# Patient Record
Sex: Male | Born: 1985 | Race: Black or African American | Hispanic: No | Marital: Single | Smoking: Never smoker
Health system: Southern US, Community
[De-identification: ages and names within clinical notes are randomized; demographics above are authoritative.]

---

## 2007-03-01 ENCOUNTER — Ambulatory Visit (HOSPITAL_COMMUNITY): Admission: RE | Admit: 2007-03-01 | Discharge: 2007-03-01 | Payer: Self-pay | Admitting: Family Medicine

## 2011-06-02 ENCOUNTER — Inpatient Hospital Stay (INDEPENDENT_AMBULATORY_CARE_PROVIDER_SITE_OTHER)
Admission: RE | Admit: 2011-06-02 | Discharge: 2011-06-02 | Disposition: A | Discharge status: Home / Self Care | Payer: Self-pay | Source: Ambulatory Visit | Attending: Family Medicine | Admitting: Family Medicine

## 2011-06-02 DIAGNOSIS — J029 Acute pharyngitis, unspecified: Secondary | ICD-10-CM

## 2012-03-06 ENCOUNTER — Encounter (HOSPITAL_COMMUNITY): Payer: Self-pay | Admitting: *Deleted

## 2012-03-06 ENCOUNTER — Emergency Department (HOSPITAL_COMMUNITY): Payer: Managed Care, Other (non HMO)

## 2012-03-06 ENCOUNTER — Emergency Department (HOSPITAL_COMMUNITY)
Admission: EM | Admit: 2012-03-06 | Discharge: 2012-03-06 | Disposition: A | Discharge status: Home / Self Care | Payer: Managed Care, Other (non HMO) | Attending: Emergency Medicine | Admitting: Emergency Medicine

## 2012-03-06 DIAGNOSIS — R079 Chest pain, unspecified: Secondary | ICD-10-CM | POA: Insufficient documentation

## 2012-03-06 LAB — CBC
HCT: 39.3 % (ref 39.0–52.0)
Hemoglobin: 13.2 g/dL (ref 13.0–17.0)
MCH: 29.5 pg (ref 26.0–34.0)
MCHC: 33.6 g/dL (ref 30.0–36.0)
MCV: 87.9 fL (ref 78.0–100.0)

## 2012-03-06 LAB — POCT I-STAT, CHEM 8
Calcium, Ion: 1.26 mmol/L — ABNORMAL HIGH (ref 1.12–1.23)
Creatinine, Ser: 1.2 mg/dL (ref 0.50–1.35)
Glucose, Bld: 95 mg/dL (ref 70–99)
Hemoglobin: 13.9 g/dL (ref 13.0–17.0)
Potassium: 3.3 mEq/L — ABNORMAL LOW (ref 3.5–5.1)

## 2012-03-06 MED ORDER — POTASSIUM CHLORIDE CRYS ER 20 MEQ PO TBCR
20.0000 meq | EXTENDED_RELEASE_TABLET | Freq: Once | ORAL | Status: AC
  Administered 2012-03-06: 20 meq via ORAL
  Filled 2012-03-06: qty 1

## 2012-03-06 MED ORDER — IBUPROFEN 800 MG PO TABS: 800.0000 mg | ORAL_TABLET | Freq: Three times a day (TID) | ORAL | Status: AC

## 2012-03-06 NOTE — ED Provider Notes (Signed)
History     CSN: [id number]  Arrival date & time 03/06/12  0011   First MD Initiated Contact with Patient 03/06/12 0139      Chief Complaint  Patient presents with  . Chest Pain    (Consider location/radiation/quality/duration/timing/severity/associated sxs/prior treatment) HPI History provided by patient. Has been having on and off chest pain the last 8 months. He is able to show me with one finger left parasternal area where hurts. When it comes it lasts seconds. Is sharp in quality and severe. No known aggravating or alleviating factors. No shortness of breath. No nausea vomiting. No diaphoresis. 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 He denies any trauma. No recent illness. No cough cold congestion. Has never been evaluated for this. Is a nonsmoker. No medical problems. No known family history of heart problems. Currently is pain-free without complaints. History reviewed. No pertinent past medical history.  History reviewed. No pertinent past surgical history.  History reviewed. No pertinent family history.  History  Substance Use Topics  . Smoking status: Never Smoker   . Smokeless tobacco: Not on file  . Alcohol Use: Yes     socially      Review of Systems  Constitutional: Negative for fever and chills.  HENT: Negative for neck pain and neck stiffness.   Eyes: Negative for pain.  Respiratory: Negative for shortness of breath.   Cardiovascular: Positive for chest pain.  Gastrointestinal: Negative for abdominal pain.  Genitourinary: Negative for dysuria.  Musculoskeletal: Negative for back pain.  Skin: Negative for rash.  Neurological: Negative for headaches.  All other systems reviewed and are negative.    Allergies  Review of patient's allergies indicates no known allergies.  Home Medications  No current outpatient prescriptions on file.  BP 128/77  Pulse 62  Temp 98.5 F (36.9 C) (Oral)  Resp 18  SpO2 98%  Physical Exam    Constitutional: He is oriented to person, place, and time. He appears well-developed and well-nourished.  HENT:  Head: Normocephalic and atraumatic.  Eyes: Conjunctivae and EOM are normal. Pupils are equal, round, and reactive to light.  Neck: Trachea normal. Neck supple. No thyromegaly present.  Cardiovascular: Normal rate, regular rhythm, S1 normal, S2 normal and normal pulses.     No systolic murmur is present   No diastolic murmur is present  Pulses:      Radial pulses are 2+ on the right side, and 2+ on the left side.  Pulmonary/Chest: Effort normal and breath sounds normal. He has no wheezes. He has no rhonchi. He has no rales. He exhibits no tenderness.       No rash or crepitus  Abdominal: Soft. Normal appearance and bowel sounds are normal. There is no tenderness. There is no CVA tenderness and negative Murphy's sign.  Musculoskeletal:       BLE:s Calves nontender, no cords or erythema, negative Homans sign  Neurological: He is alert and oriented to person, place, and time. He has normal strength. No cranial nerve deficit or sensory deficit. GCS eye subscore is 4. GCS verbal subscore is 5. GCS motor subscore is 6.  Skin: Skin is warm and dry. No rash noted. He is not diaphoretic.  Psychiatric: His speech is normal.       Cooperative and appropriate    ED Course  Procedures (including critical care time)  Results for orders placed during the hospital encounter of 03/06/12  CBC      Component Value  Range   WBC 5.8  4.0 - 10.5 K/uL   RBC 4.47  4.22 - 5.81 MIL/uL   Hemoglobin 13.2  13.0 - 17.0 g/dL   HCT 16.1  09.6 - 04.5 %   MCV 87.9  78.0 - 100.0 fL   MCH 29.5  26.0 - 34.0 pg   MCHC 33.6  30.0 - 36.0 g/dL   RDW 40.9  81.1 - 91.4 %   Platelets 178  150 - 400 K/uL  POCT I-STAT, CHEM 8      Component Value Range   Sodium 142  135 - 145 mEq/L   Potassium 3.3 (*) 3.5 - 5.1 mEq/L   Chloride 103  96 - 112 mEq/L   BUN 16  6 - 23 mg/dL   Creatinine, Ser 7.82  0.50 - 1.35  mg/dL   Glucose, Bld 95  70 - 99 mg/dL   Calcium, Ion 9.56 (*) 1.12 - 1.23 mmol/L   TCO2 26  0 - 100 mmol/L   Hemoglobin 13.9  13.0 - 17.0 g/dL   HCT 21.3  08.6 - 57.8 %  POCT I-STAT TROPONIN I      Component Value Range   Troponin i, poc 0.00  0.00 - 0.08 ng/mL   Comment 3            Dg Chest 2 View  03/06/2012  *RADIOLOGY REPORT*  Clinical Data: Left chest pain  CHEST - 2 VIEW  Comparison:  None.  Findings:  The heart size and mediastinal contours are within normal limits.  Both lungs are clear.  The visualized skeletal structures are unremarkable.  IMPRESSION: No active cardiopulmonary disease.  Original Report Authenticated By: Judie Petit. Ruel Favors, M.D.    Date: 03/06/2012  Rate: 62  Rhythm: normal sinus rhythm  QRS Axis: normal  Intervals: normal  ST/T Wave abnormalities: nonspecific ST changes  Conduction Disutrbances:none  Narrative Interpretation:   Old EKG Reviewed: none available  Asymptomatic in the emergency department.  Plan outpatient followup. Referrals provided. Reliable historian verbalizes understanding all discharge and followup instructions in addition to strict return precautions. Written precautions also given. Patient comfortable the plan and is stable for discharge home without indication for further workup or evaluation in the emergency department at this time. MDM   Nursing notes reviewed. Vital signs reviewed. Evaluated with imaging labs and EKG reviewed as above.        Sunnie Nielsen, MD 03/06/12 [phone]

## 2012-03-06 NOTE — ED Notes (Addendum)
Pt c/o intermittent chest pains since December.  Nothing he does improves or increases the pain.  The came is like a shock to his heart that lasts a second and goes away.  Denies sob, dizziness, nausea.  He states he came in tonight b/c he told his girlfriend today and she made him come in.

## 2015-08-06 ENCOUNTER — Ambulatory Visit (INDEPENDENT_AMBULATORY_CARE_PROVIDER_SITE_OTHER): Payer: Self-pay | Admitting: Family Medicine

## 2015-08-06 ENCOUNTER — Ambulatory Visit (INDEPENDENT_AMBULATORY_CARE_PROVIDER_SITE_OTHER): Payer: Self-pay

## 2015-08-06 VITALS — BP 122/72 | HR 86 | Temp 98.6°F | Resp 17 | Ht 72.0 in | Wt 200.0 lb

## 2015-08-06 DIAGNOSIS — M25512 Pain in left shoulder: Secondary | ICD-10-CM

## 2015-08-06 DIAGNOSIS — M542 Cervicalgia: Secondary | ICD-10-CM

## 2015-08-06 DIAGNOSIS — S161XXA Strain of muscle, fascia and tendon at neck level, initial encounter: Secondary | ICD-10-CM

## 2015-08-06 DIAGNOSIS — M439 Deforming dorsopathy, unspecified: Secondary | ICD-10-CM

## 2015-08-06 DIAGNOSIS — S46912A Strain of unspecified muscle, fascia and tendon at shoulder and upper arm level, left arm, initial encounter: Secondary | ICD-10-CM

## 2015-08-06 MED ORDER — MELOXICAM 15 MG PO TABS
15.0000 mg | ORAL_TABLET | Freq: Every day | ORAL | Status: DC
Start: 2015-08-06 — End: 2019-01-19

## 2015-08-06 NOTE — Progress Notes (Signed)
Subjective:    Patient ID: Eugene Wiley, male    DOB: 04-24-1986, 30 y.o.   MRN: [id number]  08/06/2015  Neck Pain; Back Pain; and Shoulder Pain   HPI This 30 y.o. male presents for evaluation of five days of neck pain. Awoke five days ago with strong neck pain.  Kept taking TYlenol every two hours without improvement. On Monday, went to [facility] on [address].  Did not perform xray; prescribed medication  (Orphenadrine citrate 100mg  every 12 hours PRN #14); worried about meningitis?  Referred to orthopedic; never received call for ortho referral; returned to UC today and referred here.  +fever of 101.0 at [facility] on Monday 08/03/15; last fever was four days ago.  Not aware that was running fever.  No chills/sweats.  Some headache; last headache five days ago.  No ear pain or sore throat.  +rhinorrhea; +nasal congestion.  +coughing; no sputum production.  Nasal congestion is clear-yellow and has greatly improved; cough has improved.  No n/v/d.  No rash.  No injury to neck.  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  No heavy lifting.  Neck pain has improved since onset; medication has bee neffective; now able to elevate hands above shoulders; now can turn head well. With flexing, still having pain; extension still causes pain.  50% improved from onset.  Last dose of medication 10:30am.  No neck trauma in past; history of shoulder issues two months ago but did not have neck pain with shoulder injury two months ago.  Tried to refill medication that was originally prescribed two months ago but unable to refill.  Tested for influenza on 08/03/15; negative.  Written out of work for entire week.  Needs note for work to return to work.  Worried about recurrent nature of shoulder pain; onset of shoulder since high school.  Had L shoulder injury in high school; no previous evaluation.  L shoulder makes a popping sound.  Not as strong in L shoulder as R side.  Weight lifting is troublesome.  With previous  episode, when medication ran out the pain returned two weeks later; shoulder pain persisted for two weeks.  This time, neck involved.  Fever resolved last night; no rhinorrhea today.  Feeling better today.  Taking Alkeseltzer cold and sinus currently.    Requesting xrays of neck and L shoulder.  Did not develop cold symptoms until 48 hours after onset of fever and neck pain.  Review of Systems  Constitutional: Positive for fever. Negative for chills, diaphoresis and fatigue.  HENT: Positive for congestion, postnasal drip and rhinorrhea. Negative for ear pain, sore throat, trouble swallowing and voice change.   Respiratory: Positive for cough. Negative for shortness of breath, wheezing and stridor.   Gastrointestinal: Negative for nausea, vomiting, abdominal pain and diarrhea.  Musculoskeletal: Positive for arthralgias. Negative for back pain, joint swelling, gait problem, neck pain and neck stiffness.  Skin: Negative for rash.  Neurological: Positive for headaches. Negative for weakness and numbness.    History reviewed. No pertinent past medical history. History reviewed. No pertinent past surgical history. No Known Allergies Current Outpatient Prescriptions  Medication Sig Dispense Refill  . orphenadrine (NORFLEX) 100 MG tablet Take 100 mg by mouth 2 (two) times daily.    . meloxicam (MOBIC) 15 MG tablet Take 1 tablet (15 mg total) by mouth daily. 30 tablet 0   No current facility-administered medications for this visit.   Social History   Social History  . Marital Status: Single  Spouse Name: N/A  . Number of Children: N/A  . Years of Education: N/A   Occupational History  . Not on file.   Social History Main Topics  . Smoking status: Never Smoker   . Smokeless tobacco: Not on file  . Alcohol Use: Yes     Comment: socially  . Drug Use: No  . Sexual Activity: Not on file   Other Topics Concern  . Not on file   Social History Narrative   History reviewed. No  pertinent family history.     Objective:    BP 122/72 mmHg  Pulse 86  Temp(Src) 98.6 F (37 C) (Oral)  Resp 17  Ht 6' (1.829 m)  Wt 200 lb (90.719 kg)  BMI 27.12 kg/m2  SpO2 98% Physical Exam  Constitutional: He is oriented to person, place, and time. He appears well-developed and well-nourished. No distress.  HENT:  Head: Normocephalic and atraumatic.  Right Ear: External ear normal.  Left Ear: External ear normal.  Nose: Nose normal.  Mouth/Throat: Oropharynx is clear and moist.  Eyes: Conjunctivae and EOM are normal. Pupils are equal, round, and reactive to light.  Neck: Normal range of motion. Neck supple. Carotid bruit is not present. No thyromegaly present.  Cardiovascular: Normal rate, regular rhythm, normal heart sounds and intact distal pulses.  Exam reveals no gallop and no friction rub.   No murmur heard. Pulmonary/Chest: Effort normal and breath sounds normal. He has no wheezes. He has no rales.  Abdominal: Soft. Bowel sounds are normal. He exhibits no distension and no mass. There is no tenderness. There is no rebound and no guarding.  Musculoskeletal:       Right shoulder: Normal. He exhibits normal range of motion, no tenderness, no bony tenderness, no swelling, no pain, no spasm, normal pulse and normal strength.       Left shoulder: Normal. He exhibits normal range of motion, no tenderness, no bony tenderness, no swelling, no pain, no spasm, normal pulse and normal strength.       Cervical back: He exhibits tenderness, bony tenderness, pain and spasm. He exhibits normal range of motion, no swelling and normal pulse.       Thoracic back: Normal. He exhibits normal range of motion, no tenderness, no bony tenderness, no swelling, no pain and no spasm.       Lumbar back: Normal. He exhibits normal range of motion, no tenderness, no bony tenderness, no swelling, no edema, no pain and no spasm.  Cervical spine: +tender midline at C6-7; +tender paraspinal regions R; full  ROM cervical spine without limitation.  Motor 5/5 BUE.  Grip 5/5.   Lymphadenopathy:    He has no cervical adenopathy.  Neurological: He is alert and oriented to person, place, and time. No cranial nerve deficit.  Skin: Skin is warm and dry. No rash noted. He is not diaphoretic.  Psychiatric: He has a normal mood and affect. His behavior is normal.  Nursing note and vitals reviewed.   [facility] reading (PRIMARY) by  Dr. Katrinka BlazingSmith.  L SHOULDER: NAD; CERVICAL SPINE: COMPRESSION DEFORMITY C6.      Assessment & Plan:   1. Left shoulder pain   2. Neck pain     Orders Placed This Encounter  Procedures  . DG Shoulder Left    Standing Status: Future     Number of Occurrences: 1     Standing Expiration Date: 08/05/2016    Order Specific Question:  Reason for Exam (SYMPTOM  OR DIAGNOSIS  REQUIRED)    Answer:  L shoulder pain chronic and intermittent    Order Specific Question:  Preferred imaging location?    Answer:  External  . DG Cervical Spine 2 or 3 views    Standing Status: Future     Number of Occurrences: 1     Standing Expiration Date: 08/05/2016    Order Specific Question:  Reason for Exam (SYMPTOM  OR DIAGNOSIS REQUIRED)    Answer:  neck pain onset four days ago; no injury    Order Specific Question:  Preferred imaging location?    Answer:  External   Meds ordered this encounter  Medications  . orphenadrine (NORFLEX) 100 MG tablet    Sig: Take 100 mg by mouth 2 (two) times daily.  . meloxicam (MOBIC) 15 MG tablet    Sig: Take 1 tablet (15 mg total) by mouth daily.    Dispense:  30 tablet    Refill:  0    No Follow-up on file.    Laisa Larrick Paulita Fujita, M.D. [facility] [address] [place], [place]  [address] [phone] phone [phone] fax

## 2015-08-06 NOTE — Patient Instructions (Signed)

## 2015-08-18 ENCOUNTER — Ambulatory Visit
Admission: RE | Admit: 2015-08-18 | Discharge: 2015-08-18 | Disposition: A | Discharge status: Home / Self Care | Payer: No Typology Code available for payment source | Source: Ambulatory Visit | Attending: Family Medicine | Admitting: Family Medicine

## 2015-08-18 MED ORDER — GADOBENATE DIMEGLUMINE 529 MG/ML IV SOLN
18.0000 mL | Freq: Once | INTRAVENOUS | Status: AC | PRN
Start: 2015-08-18 — End: 2015-08-18
  Administered 2015-08-18: 18 mL via INTRAVENOUS

## 2015-08-23 ENCOUNTER — Ambulatory Visit (INDEPENDENT_AMBULATORY_CARE_PROVIDER_SITE_OTHER): Payer: Self-pay | Admitting: Emergency Medicine

## 2015-08-23 VITALS — BP 122/74 | HR 75 | Temp 98.3°F | Resp 16 | Ht 73.0 in | Wt 196.0 lb

## 2015-08-23 DIAGNOSIS — R319 Hematuria, unspecified: Secondary | ICD-10-CM

## 2015-08-23 DIAGNOSIS — Z021 Encounter for pre-employment examination: Secondary | ICD-10-CM

## 2015-08-23 DIAGNOSIS — M542 Cervicalgia: Secondary | ICD-10-CM

## 2015-08-23 DIAGNOSIS — M25512 Pain in left shoulder: Secondary | ICD-10-CM

## 2015-08-23 DIAGNOSIS — R300 Dysuria: Secondary | ICD-10-CM

## 2015-08-23 LAB — COMPLETE METABOLIC PANEL WITH GFR
ALT: 14 U/L (ref 9–46)
AST: 18 U/L (ref 10–40)
Albumin: 3.9 g/dL (ref 3.6–5.1)
Alkaline Phosphatase: 61 U/L (ref 40–115)
BUN: 15 mg/dL (ref 7–25)
CALCIUM: 9.1 mg/dL (ref 8.6–10.3)
CHLORIDE: 106 mmol/L (ref 98–110)
CO2: 28 mmol/L (ref 20–31)
Creat: 0.94 mg/dL (ref 0.60–1.35)
GFR, Est African American: >89 mL/min (ref >=60)
GFR, Est Non African American: >89 mL/min (ref >=60)
Glucose, Bld: 91 mg/dL (ref 65–99)
POTASSIUM: 4 mmol/L (ref 3.5–5.3)
SODIUM: 140 mmol/L (ref 135–146)
Total Bilirubin: 0.4 mg/dL (ref 0.2–1.2)
Total Protein: 7.4 g/dL (ref 6.1–8.1)

## 2015-08-23 LAB — POCT URINALYSIS DIP (MANUAL ENTRY)
BILIRUBIN UA: NEGATIVE
BILIRUBIN UA: NEGATIVE
GLUCOSE UA: NEGATIVE
LEUKOCYTES UA: NEGATIVE
Nitrite, UA: NEGATIVE
Urobilinogen, UA: 0.2
pH, UA: 6

## 2015-08-23 LAB — POC MICROSCOPIC URINALYSIS (UMFC)

## 2015-08-23 NOTE — Progress Notes (Addendum)
By signing my name below, I, Javier Docker, attest that this documentation has been prepared under the direction and in the presence of Earl Lites, MD. Electronically Signed: Javier Docker, ER Scribe. 08/23/2015. 12:07 PM.   Chief Complaint:  Chief Complaint  Patient presents with  . Follow-up    shoulder and neck pain  . Dysuria    along with dark color urine    HPI: Eugene Wiley is a 30 y.o. male who reports to [facility] today complaining of continued neck pain. His sx started in August or September with progressively worsening pain in his neck. The pain would be at its worst point in the morning when he woke up and would improve through the course of the day. He has been seen previously for the same sx and was prescribed norflex and mobic. After he completed the medications the pain recurred. He was then seen again and prescribed the same medications with relief of his pain, but he is considered his pain will recur again once he completes the course of his medication.   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  History reviewed. No pertinent past medical history. History reviewed. No pertinent past surgical history. Social History   Social History  . Marital Status: Single    Spouse Name: N/A  . Number of Children: N/A  . Years of Education: N/A   Social History Main Topics  . Smoking status: Never Smoker   . Smokeless tobacco: None  . Alcohol Use: Yes     Comment: socially  . Drug Use: No  . Sexual Activity: Not Asked   Other Topics Concern  . None   Social History Narrative   No family history on file. No Known Allergies Prior to Admission medications   Medication Sig Start Date End Date Taking? Authorizing Provider  meloxicam (MOBIC) 15 MG tablet Take 1 tablet (15 mg total) by mouth daily. 08/06/15   Ethelda Chick, MD  orphenadrine (NORFLEX) 100 MG tablet Take 100 mg by mouth 2 (two) times daily.    Historical Provider, MD      ROS: The patient denies fevers, chills, night sweats, unintentional weight loss, chest pain, palpitations, wheezing, dyspnea on exertion, nausea, vomiting, abdominal pain, dysuria, hematuria, melena, numbness, weakness, or tingling.   All other systems have been reviewed and were otherwise negative with the exception of those mentioned in the HPI and as above.    PHYSICAL EXAM: Filed Vitals:   08/23/15 1105  BP: 122/74  Pulse: 75  Temp: 98.3 F (36.8 C)  Resp: 16   Body mass index is 25.86 kg/(m^2).   General: Alert, no acute distress HEENT:  Normocephalic, atraumatic, oropharynx patent. Eye: Nonie Hoyer [facility] Cardiovascular:  Regular rate and rhythm, no rubs murmurs or gallops.  No Carotid bruits, radial pulse intact. No pedal edema.  Respiratory: Clear to auscultation bilaterally.  No wheezes, rales, or rhonchi.  No cyanosis, no use of accessory musculature Abdominal: No organomegaly, abdomen is soft and non-tender, positive bowel sounds.  No masses. Musculoskeletal: Gait intact. No edema, tenderness Skin: No rashes. Neurologic: Facial musculature symmetric. Psychiatric: Patient acts appropriately throughout our interaction. Lymphatic: No cervical or submandibular lymphadenopathy Results for orders placed or performed in visit on 08/23/15  POCT urinalysis dipstick  Result Value Ref Range   Color, UA yellow yellow   Clarity, UA clear clear   Glucose, UA negative negative   Bilirubin, UA negative negative   Ketones,  POC UA negative negative   Spec Grav, UA >=1.030    Blood, UA moderate (A) negative   pH, UA 6.0    Protein Ur, POC trace (A) negative   Urobilinogen, UA 0.2    Nitrite, UA Negative Negative   Leukocytes, UA Negative Negative  POCT Microscopic Urinalysis ([facility])  Result Value Ref Range   WBC,UR,HPF,POC None None WBC/hpf   RBC,UR,HPF,POC Moderate (A) None RBC/hpf   Bacteria Few (A) None, Too numerous to count   Mucus Present (A) Absent   Epithelial Cells,  UR Per Microscopy Few (A) None, Too numerous to count cells/hpf     LABS:    EKG/XRAY:   Primary read interpreted by Dr. Cleta Alberts at [facility].   ASSESSMENT/PLAN:  patient has signiicant hematuria. CT abdomen pelvis ordered. He is referred to orthopedics to work with him on a physical therapy program to decrease his episodes of neck and shoulder pain I personally performed the services described in this documentation, which was scribed in my presence. The recorded information has been reviewed and is accurate.Michaell Cowing sideeffects, risk and benefits, and alternatives of medications d/w patient. Patient is aware that all medications have potential sideeffects and we are unable to predict every sideeffect or drug-drug interaction that may occur.  Lesle Chris MD 08/23/2015 12:07 PM

## 2015-08-23 NOTE — Patient Instructions (Signed)
Cervical Sprain  A cervical sprain is an injury in the neck in which the strong, fibrous tissues (ligaments) that connect your neck bones stretch or tear. Cervical sprains can range from mild to severe. Severe cervical sprains can cause the neck vertebrae to be unstable. This can lead to damage of the spinal cord and can result in serious nervous system problems. The amount of time it takes for a cervical sprain to get better depends on the cause and extent of the injury. Most cervical sprains heal in 1 to 3 weeks.  CAUSES   Severe cervical sprains may be caused by:    Contact sport injuries (such as from football, rugby, wrestling, hockey, auto racing, gymnastics, diving, martial arts, or boxing).    Motor vehicle collisions.    Whiplash injuries. This is an injury from a sudden forward and backward whipping movement of the head and neck.   Falls.   Mild cervical sprains may be caused by:    Being in an awkward position, such as while cradling a telephone between your ear and shoulder.    Sitting in a chair that does not offer proper support.    Working at a poorly designed computer station.    Looking up or down for long periods of time.   SYMPTOMS    Pain, soreness, stiffness, or a burning sensation in the front, back, or sides of the neck. This discomfort may develop immediately after the injury or slowly, 24 hours or more after the injury.    Pain or tenderness directly in the middle of the back of the neck.    Shoulder or upper back pain.    Limited ability to move the neck.    Headache.    Dizziness.    Weakness, numbness, or tingling in the hands or arms.    Muscle spasms.    Difficulty swallowing or chewing.    Tenderness and swelling of the neck.   DIAGNOSIS   Most of the time your health care provider can diagnose a cervical sprain by taking your history and doing a physical exam. Your health care provider will ask about previous neck injuries and any known neck  problems, such as arthritis in the neck. X-rays may be taken to find out if there are any other problems, such as with the bones of the neck. Other tests, such as a CT scan or MRI, may also be needed.   TREATMENT   Treatment depends on the severity of the cervical sprain. Mild sprains can be treated with rest, keeping the neck in place (immobilization), and pain medicines. Severe cervical sprains are immediately immobilized. Further treatment is done to help with pain, muscle spasms, and other symptoms and may include:   Medicines, such as pain relievers, numbing medicines, or muscle relaxants.    Physical therapy. This may involve stretching exercises, strengthening exercises, and posture training. Exercises and improved posture can help stabilize the neck, strengthen muscles, and help stop symptoms from returning.   HOME CARE INSTRUCTIONS    Put ice on the injured area.     Put ice in a plastic bag.     Place a towel between your skin and the bag.     Leave the ice on for 15-20 minutes, 3-4 times a day.    If your injury was severe, you may have been given a cervical collar to wear. A cervical collar is a two-piece collar designed to keep your neck from moving while it heals.      Do not remove the collar unless instructed by your health care provider.    If you have long hair, keep it outside of the collar.    Ask your health care provider before making any adjustments to your collar. Minor adjustments may be required over time to improve comfort and reduce pressure on your chin or on the back of your head.    Ifyou are allowed to remove the collar for cleaning or bathing, follow your health care provider's instructions on how to do so safely.    Keep your collar clean by wiping it with mild soap and water and drying it completely. If the collar you have been given includes removable pads, remove them every 1-2 days and hand wash them with soap and water. Allow them to air dry. They should be completely  dry before you wear them in the collar.    If you are allowed to remove the collar for cleaning and bathing, wash and dry the skin of your neck. Check your skin for irritation or sores. If you see any, tell your health care provider.    Do not drive while wearing the collar.    Only take over-the-counter or prescription medicines for pain, discomfort, or fever as directed by your health care provider.    Keep all follow-up appointments as directed by your health care provider.    Keep all physical therapy appointments as directed by your health care provider.    Make any needed adjustments to your workstation to promote good posture.    Avoid positions and activities that make your symptoms worse.    Warm up and stretch before being active to help prevent problems.   SEEK MEDICAL CARE IF:    Your pain is not controlled with medicine.    You are unable to decrease your pain medicine over time as planned.    Your activity level is not improving as expected.   SEEK IMMEDIATE MEDICAL CARE IF:    You develop any bleeding.   You develop stomach upset.   You have signs of an allergic reaction to your medicine.    Your symptoms get worse.    You develop new, unexplained symptoms.    You have numbness, tingling, weakness, or paralysis in any part of your body.   MAKE SURE YOU:    Understand these instructions.   Will watch your condition.   Will get help right away if you are not doing well or get worse.     This information is not intended to replace advice given to you by your health care provider. Make sure you discuss any questions you have with your health care provider.     Document Released: 05/15/2007 Document Revised: 07/23/2013 Document Reviewed: 01/23/2013  [employer]vier Interactive Patient Education 2016 [employer]

## 2015-08-24 LAB — URINE CULTURE
COLONY COUNT: NO GROWTH
Organism ID, Bacteria: NO GROWTH

## 2015-08-24 LAB — GC/CHLAMYDIA PROBE AMP
CT PROBE, AMP APTIMA: NOT DETECTED
GC PROBE AMP APTIMA: NOT DETECTED

## 2018-12-31 DIAGNOSIS — G039 Meningitis, unspecified: Secondary | ICD-10-CM

## 2018-12-31 HISTORY — DX: Meningitis, unspecified: G03.9

## 2019-01-19 ENCOUNTER — Emergency Department (HOSPITAL_COMMUNITY): Payer: Managed Care, Other (non HMO)

## 2019-01-19 ENCOUNTER — Inpatient Hospital Stay (HOSPITAL_COMMUNITY)
Admission: EM | Admit: 2019-01-19 | Discharge: 2019-01-22 | DRG: 075 | Disposition: A | Discharge status: Home / Self Care | Payer: Managed Care, Other (non HMO) | Attending: Internal Medicine | Admitting: Internal Medicine

## 2019-01-19 ENCOUNTER — Other Ambulatory Visit: Payer: Self-pay

## 2019-01-19 DIAGNOSIS — R509 Fever, unspecified: Secondary | ICD-10-CM

## 2019-01-19 DIAGNOSIS — N179 Acute kidney failure, unspecified: Secondary | ICD-10-CM

## 2019-01-19 DIAGNOSIS — G039 Meningitis, unspecified: Secondary | ICD-10-CM | POA: Diagnosis not present

## 2019-01-19 DIAGNOSIS — J324 Chronic pansinusitis: Secondary | ICD-10-CM | POA: Diagnosis present

## 2019-01-19 DIAGNOSIS — A879 Viral meningitis, unspecified: Secondary | ICD-10-CM | POA: Diagnosis not present

## 2019-01-19 DIAGNOSIS — A86 Unspecified viral encephalitis: Secondary | ICD-10-CM | POA: Diagnosis not present

## 2019-01-19 DIAGNOSIS — Z20828 Contact with and (suspected) exposure to other viral communicable diseases: Secondary | ICD-10-CM | POA: Diagnosis present

## 2019-01-19 DIAGNOSIS — E860 Dehydration: Secondary | ICD-10-CM | POA: Diagnosis present

## 2019-01-19 DIAGNOSIS — N170 Acute kidney failure with tubular necrosis: Secondary | ICD-10-CM | POA: Diagnosis present

## 2019-01-19 DIAGNOSIS — R519 Headache, unspecified: Secondary | ICD-10-CM

## 2019-01-19 DIAGNOSIS — Z79899 Other long term (current) drug therapy: Secondary | ICD-10-CM

## 2019-01-19 DIAGNOSIS — R3129 Other microscopic hematuria: Secondary | ICD-10-CM | POA: Diagnosis present

## 2019-01-19 LAB — COMPREHENSIVE METABOLIC PANEL
ALT: 57 U/L — ABNORMAL HIGH (ref 0–44)
AST: 36 U/L (ref 15–41)
Albumin: 3.4 g/dL — ABNORMAL LOW (ref 3.5–5.0)
Alkaline Phosphatase: 76 U/L (ref 38–126)
Anion gap: 9 (ref 5–15)
BUN: 9 mg/dL (ref 6–20)
CO2: 29 mmol/L (ref 22–32)
Calcium: 9.3 mg/dL (ref 8.9–10.3)
Chloride: 101 mmol/L (ref 98–111)
Creatinine, Ser: 1.49 mg/dL — ABNORMAL HIGH (ref 0.61–1.24)
GFR calc Af Amer: >60 mL/min (ref >60)
GFR calc non Af Amer: >60 mL/min (ref >60)
Glucose, Bld: 133 mg/dL — ABNORMAL HIGH (ref 70–99)
Potassium: 4.1 mmol/L (ref 3.5–5.1)
Sodium: 139 mmol/L (ref 135–145)
Total Bilirubin: 1 mg/dL (ref 0.3–1.2)
Total Protein: 8.1 g/dL (ref 6.5–8.1)

## 2019-01-19 LAB — CSF CELL COUNT WITH DIFFERENTIAL
Lymphs, CSF: 40 % (ref 40–80)
Lymphs, CSF: 51 % (ref 40–80)
Monocyte-Macrophage-Spinal Fluid: 25 % (ref 15–45)
Monocyte-Macrophage-Spinal Fluid: 9 % — ABNORMAL LOW (ref 15–45)
RBC Count, CSF: 29 /mm3 — ABNORMAL HIGH
RBC Count, CSF: 4 /mm3 — ABNORMAL HIGH
Segmented Neutrophils-CSF: 35 % — ABNORMAL HIGH (ref 0–6)
Segmented Neutrophils-CSF: 40 % — ABNORMAL HIGH (ref 0–6)
Tube #: 1
Tube #: 4
WBC, CSF: 77 /mm3 (ref 0–5)
WBC, CSF: 79 /mm3 (ref 0–5)

## 2019-01-19 LAB — CBC WITH DIFFERENTIAL/PLATELET
Abs Immature Granulocytes: 0.02 10*3/uL (ref 0.00–0.07)
Basophils Absolute: 0 10*3/uL (ref 0.0–0.1)
Basophils Relative: 0 %
Eosinophils Absolute: 0.1 10*3/uL (ref 0.0–0.5)
Eosinophils Relative: 1 %
HCT: 39.3 % (ref 39.0–52.0)
Hemoglobin: 12.9 g/dL — ABNORMAL LOW (ref 13.0–17.0)
Immature Granulocytes: 0 %
Lymphocytes Relative: 17 %
Lymphs Abs: 1.4 10*3/uL (ref 0.7–4.0)
MCH: 29.2 pg (ref 26.0–34.0)
MCHC: 32.8 g/dL (ref 30.0–36.0)
MCV: 88.9 fL (ref 80.0–100.0)
Monocytes Absolute: 0.7 10*3/uL (ref 0.1–1.0)
Monocytes Relative: 9 %
Neutro Abs: 5.9 10*3/uL (ref 1.7–7.7)
Neutrophils Relative %: 73 %
Platelets: 199 10*3/uL (ref 150–400)
RBC: 4.42 MIL/uL (ref 4.22–5.81)
RDW: 11.9 % (ref 11.5–15.5)
WBC: 8.2 10*3/uL (ref 4.0–10.5)
nRBC: 0 % (ref 0.0–0.2)

## 2019-01-19 LAB — URINALYSIS, ROUTINE W REFLEX MICROSCOPIC
Bacteria, UA: NONE SEEN
Bilirubin Urine: NEGATIVE
Glucose, UA: NEGATIVE mg/dL
Ketones, ur: NEGATIVE mg/dL
Leukocytes,Ua: NEGATIVE
Nitrite: NEGATIVE
Protein, ur: 100 mg/dL — AB
RBC / HPF: >50 RBC/hpf — ABNORMAL HIGH (ref 0–5)
Specific Gravity, Urine: 1.012 (ref 1.005–1.030)
pH: 8 (ref 5.0–8.0)

## 2019-01-19 LAB — CRYPTOCOCCAL ANTIGEN, CSF: Crypto Ag: NEGATIVE

## 2019-01-19 LAB — SARS CORONAVIRUS 2 BY RT PCR (HOSPITAL ORDER, PERFORMED IN ~~LOC~~ HOSPITAL LAB): SARS Coronavirus 2: NEGATIVE

## 2019-01-19 LAB — LACTIC ACID, PLASMA: Lactic Acid, Venous: 1.4 mmol/L (ref 0.5–1.9)

## 2019-01-19 LAB — PROTEIN, CSF: Total  Protein, CSF: 47 mg/dL — ABNORMAL HIGH (ref 15–45)

## 2019-01-19 LAB — GLUCOSE, CSF: Glucose, CSF: 69 mg/dL (ref 40–70)

## 2019-01-19 MED ORDER — ACETAMINOPHEN 325 MG PO TABS
650.0000 mg | ORAL_TABLET | Freq: Four times a day (QID) | ORAL | Status: DC | PRN
  Administered 2019-01-19 – 2019-01-21 (×3): 650 mg via ORAL
  Filled 2019-01-19 (×4): qty 2

## 2019-01-19 MED ORDER — SODIUM CHLORIDE 0.9 % IV BOLUS
1000.0000 mL | Freq: Once | INTRAVENOUS | Status: AC
  Administered 2019-01-19: 1000 mL via INTRAVENOUS

## 2019-01-19 MED ORDER — ACETAMINOPHEN 650 MG RE SUPP: 650.0000 mg | Freq: Four times a day (QID) | RECTAL | Status: DC | PRN

## 2019-01-19 MED ORDER — ENOXAPARIN SODIUM 40 MG/0.4ML ~~LOC~~ SOLN
40.0000 mg | SUBCUTANEOUS | Status: DC
  Administered 2019-01-19 – 2019-01-21 (×3): 40 mg via SUBCUTANEOUS
  Filled 2019-01-19 (×4): qty 0.4

## 2019-01-19 MED ORDER — SODIUM CHLORIDE 0.9 % IV SOLN
2.0000 g | Freq: Once | INTRAVENOUS | Status: AC
  Administered 2019-01-19: 2 g via INTRAVENOUS
  Filled 2019-01-19: qty 20

## 2019-01-19 MED ORDER — SODIUM CHLORIDE 0.9% FLUSH: 3.0000 mL | Freq: Two times a day (BID) | INTRAVENOUS | Status: DC

## 2019-01-19 MED ORDER — ACETAMINOPHEN 500 MG PO TABS
1000.0000 mg | ORAL_TABLET | Freq: Once | ORAL | Status: AC
  Administered 2019-01-19: 18:00:00 1000 mg via ORAL
  Filled 2019-01-19: qty 2

## 2019-01-19 MED ORDER — LIDOCAINE HCL (PF) 1 % IJ SOLN
5.0000 mL | Freq: Once | INTRAMUSCULAR | Status: AC
  Administered 2019-01-19: 5 mL via INTRADERMAL
  Filled 2019-01-19: qty 5

## 2019-01-19 MED ORDER — VANCOMYCIN HCL IN DEXTROSE 1-5 GM/200ML-% IV SOLN
1000.0000 mg | Freq: Once | INTRAVENOUS | Status: AC
  Administered 2019-01-19: 1000 mg via INTRAVENOUS
  Filled 2019-01-19: qty 200

## 2019-01-19 MED ORDER — DEXAMETHASONE SODIUM PHOSPHATE 10 MG/ML IJ SOLN
10.0000 mg | Freq: Once | INTRAMUSCULAR | Status: AC
  Administered 2019-01-19: 10 mg via INTRAVENOUS
  Filled 2019-01-19: qty 1

## 2019-01-19 MED ORDER — DIPHENHYDRAMINE HCL 50 MG/ML IJ SOLN
12.5000 mg | Freq: Once | INTRAMUSCULAR | Status: AC
  Administered 2019-01-19: 12.5 mg via INTRAVENOUS
  Filled 2019-01-19: qty 1

## 2019-01-19 MED ORDER — FENTANYL CITRATE (PF) 100 MCG/2ML IJ SOLN
50.0000 ug | Freq: Once | INTRAMUSCULAR | Status: AC
  Administered 2019-01-19: 50 ug via INTRAVENOUS
  Filled 2019-01-19: qty 2

## 2019-01-19 MED ORDER — LORAZEPAM 2 MG/ML IJ SOLN
1.0000 mg | Freq: Once | INTRAMUSCULAR | Status: AC
  Administered 2019-01-19: 1 mg via INTRAVENOUS
  Filled 2019-01-19: qty 1

## 2019-01-19 MED ORDER — LIDOCAINE HCL (PF) 1 % IJ SOLN
INTRAMUSCULAR | Status: AC
  Filled 2019-01-19: qty 5

## 2019-01-19 MED ORDER — DOXYCYCLINE HYCLATE 100 MG PO TABS
100.0000 mg | ORAL_TABLET | Freq: Once | ORAL | Status: AC
  Administered 2019-01-19: 100 mg via ORAL
  Filled 2019-01-19: qty 1

## 2019-01-19 MED ORDER — PROCHLORPERAZINE EDISYLATE 10 MG/2ML IJ SOLN
10.0000 mg | Freq: Once | INTRAMUSCULAR | Status: AC
  Administered 2019-01-19: 10 mg via INTRAVENOUS
  Filled 2019-01-19: qty 2

## 2019-01-19 NOTE — ED Notes (Signed)
Pt stated that he did not want the HIV test done. Pt stated that he already had it done the other day.

## 2019-01-19 NOTE — ED Notes (Signed)
ED TO INPATIENT HANDOFF REPORT  ED Nurse Name and Phone #: [phone] [name]  S Name/Age/Gender Eugene Wiley 33 y.o. male Room/Bed: 044C/044C  Code Status   Code Status: Not on file  Home/SNF/Other Home Patient oriented to: self, place, time and situation Is this baseline? Yes   Triage Complete: Triage complete  Chief Complaint fever/ha  Triage Note Pt in with c/o severe HA with neck pain x 5 days, reported temp of 104.2 PTA. Pt reporting light sensitivity and nausea   Allergies No Known Allergies  Level of Care/Admitting Diagnosis ED Disposition    ED Disposition Condition Comment   Admit  The patient appears reasonably stabilized for admission considering the current resources, flow, and capabilities available in the ED at this time, and I doubt any other [facility] requiring further screening and/or treatment in the ED prior to admission is  present.       B Medical/Surgery History No past medical history on file. No past surgical history on file.   A IV Location/Drains/Wounds Patient Lines/Drains/Airways Status   Active Line/Drains/Airways    Name:   Placement date:   Placement time:   Site:   Days:   Peripheral IV 01/19/19 Right Antecubital   01/19/19    1240    Antecubital   less than 1          Intake/Output Last 24 hours No intake or output data in the 24 hours ending 01/19/19 2000  Labs/Imaging Results for orders placed or performed during the hospital encounter of 01/19/19 (from the past 48 hour(s))  CBC with Differential     Status: Abnormal   Collection Time: 01/19/19 12:05 PM  Result Value Ref Range   WBC 8.2 4.0 - 10.5 K/uL   RBC 4.42 4.22 - 5.81 MIL/uL   Hemoglobin 12.9 (L) 13.0 - 17.0 g/dL   HCT 54.039.3 98.139.0 - 19.152.0 %   MCV 88.9 80.0 - 100.0 fL   MCH 29.2 26.0 - 34.0 pg   MCHC 32.8 30.0 - 36.0 g/dL   RDW 47.811.9 29.511.5 - 62.115.5 %   Platelets 199 150 - 400 K/uL   nRBC 0.0 0.0 - 0.2 %   Neutrophils Relative % 73 %   Neutro Abs 5.9 1.7 - 7.7 K/uL    Lymphocytes Relative 17 %   Lymphs Abs 1.4 0.7 - 4.0 K/uL   Monocytes Relative 9 %   Monocytes Absolute 0.7 0.1 - 1.0 K/uL   Eosinophils Relative 1 %   Eosinophils Absolute 0.1 0.0 - 0.5 K/uL   Basophils Relative 0 %   Basophils Absolute 0.0 0.0 - 0.1 K/uL   Immature Granulocytes 0 %   Abs Immature Granulocytes 0.02 0.00 - 0.07 K/uL    Comment: Performed at [facility], [address] [place], [place] [address]  Comprehensive metabolic panel     Status: Abnormal   Collection Time: 01/19/19 12:05 PM  Result Value Ref Range   Sodium 139 135 - 145 mmol/L   Potassium 4.1 3.5 - 5.1 mmol/L   Chloride 101 98 - 111 mmol/L   CO2 29 22 - 32 mmol/L   Glucose, Bld 133 (H) 70 - 99 mg/dL   BUN 9 6 - 20 mg/dL   Creatinine, Ser 7.841.49 (H) 0.61 - 1.24 mg/dL   Calcium 9.3 8.9 - 69.610.3 mg/dL   Total Protein 8.1 6.5 - 8.1 g/dL   Albumin 3.4 (L) 3.5 - 5.0 g/dL   AST 36 15 - 41 U/L  ALT 57 (H) 0 - 44 U/L   Alkaline Phosphatase 76 38 - 126 U/L   Total Bilirubin 1.0 0.3 - 1.2 mg/dL   GFR calc non Af Amer >60 >60 mL/min   GFR calc Af Amer >60 >60 mL/min   Anion gap 9 5 - 15    Comment: Performed at [facility], [address] [place], [place] [address]  Lactic acid, plasma     Status: None   Collection Time: 01/19/19 12:05 PM  Result Value Ref Range   Lactic Acid, Venous 1.4 0.5 - 1.9 mmol/L    Comment: Performed at [facility], [address] [place], [place] [address]  SARS Coronavirus 2 (CEPHEID- Performed in [facility] hospital lab), Hosp Order     Status: None   Collection Time: 01/19/19 12:46 PM   Specimen: Nasopharyngeal Swab  Result Value Ref Range   SARS Coronavirus 2 NEGATIVE NEGATIVE    Comment: (NOTE) If result is NEGATIVE SARS-CoV-2 target nucleic acids are NOT DETECTED. The SARS-CoV-2 RNA is generally detectable in upper and lower  respiratory specimens during the acute phase of infection. The lowest  concentration of SARS-CoV-2 viral copies this assay can  detect is 250  copies / mL. A negative result does not preclude SARS-CoV-2 infection  and should not be used as the sole basis for treatment or other  patient management decisions.  A negative result may occur with  improper specimen collection / handling, submission of specimen other  than nasopharyngeal swab, presence of viral mutation(s) within the  areas targeted by this assay, and inadequate number of viral copies  (<250 copies / mL). A negative result must be combined with clinical  observations, patient history, and epidemiological information. If result is POSITIVE SARS-CoV-2 target nucleic acids are DETECTED. The SARS-CoV-2 RNA is generally detectable in upper and lower  respiratory specimens dur ing the acute phase of infection.  Positive  results are indicative of active infection with SARS-CoV-2.  Clinical  correlation with patient history and other diagnostic information is  necessary to determine patient infection status.  Positive results do  not rule out bacterial infection or co-infection with other viruses. If result is PRESUMPTIVE POSTIVE SARS-CoV-2 nucleic acids MAY BE PRESENT.   A presumptive positive result was obtained on the submitted specimen  and confirmed on repeat testing.  While 2019 novel coronavirus  (SARS-CoV-2) nucleic acids may be present in the submitted sample  additional confirmatory testing may be necessary for epidemiological  and / or clinical management purposes  to differentiate between  SARS-CoV-2 and other Sarbecovirus currently known to infect humans.  If clinically indicated additional testing with an alternate test  methodology [phone]([id number]) is advised. The SARS-CoV-2 RNA is generally  detectable in upper and lower respiratory sp ecimens during the acute  phase of infection. The expected result is Negative. Fact Sheet for Patients:  BoilerBrush.com.cyhttps://www.fda.gov/media/136312/download Fact Sheet for Healthcare  Providers: https://pope.com/https://www.fda.gov/media/136313/download This test is not yet approved or cleared by the [place] FDA and has been authorized for detection and/or diagnosis of SARS-CoV-2 by FDA under an Emergency Use Authorization (EUA).  This EUA will remain in effect (meaning this test can be used) for the duration of the COVID-19 declaration under Section 564(b)(1) of the Act, 21 U.S.C. section 360bbb-3(b)(1), unless the authorization is terminated or revoked sooner. Performed at [facility], [address] [place], [place] [address]   Urinalysis, Routine w reflex microscopic     Status: Abnormal  Collection Time: 01/19/19  3:20 PM  Result Value Ref Range   Color, Urine YELLOW YELLOW   APPearance CLEAR CLEAR   Specific Gravity, Urine 1.012 1.005 - 1.030   pH 8.0 5.0 - 8.0   Glucose, UA NEGATIVE NEGATIVE mg/dL   Hgb urine dipstick MODERATE (A) NEGATIVE   Bilirubin Urine NEGATIVE NEGATIVE   Ketones, ur NEGATIVE NEGATIVE mg/dL   Protein, ur 782100 (A) NEGATIVE mg/dL   Nitrite NEGATIVE NEGATIVE   Leukocytes,Ua NEGATIVE NEGATIVE   RBC / HPF >50 (H) 0 - 5 RBC/hpf   WBC, UA 0-5 0 - 5 WBC/hpf   Bacteria, UA NONE SEEN NONE SEEN   Mucus PRESENT     Comment: Performed at [facility], [address] [place], [place] [address]  CSF cell count with differential collection tube #: 1     Status: Abnormal   Collection Time: 01/19/19  4:54 PM  Result Value Ref Range   Tube # 1    Color, CSF COLORLESS COLORLESS   Appearance, CSF CLEAR (A) CLEAR   Supernatant CLEAR    RBC Count, CSF 29 (H) 0 /cu mm   WBC, CSF 77 (HH) 0 - 5 /cu mm    Comment: CRITICAL RESULT CALLED TO, READ BACK BY AND VERIFIED WITH: Lolly MustacheVANHRETSCHMAR, T RN @ 1842 ON 01/19/2019 BY TEMOCHE, H    Segmented Neutrophils-CSF 40 (H) 0 - 6 %   Lymphs, CSF 51 40 - 80 %   Monocyte-Macrophage-Spinal Fluid 9 (L) 15 - 45 %    Comment: Performed at [facility], [address] [place], [place] [address]  CSF cell  count with differential collection tube #: 4     Status: Abnormal   Collection Time: 01/19/19  4:54 PM  Result Value Ref Range   Tube # 4    Color, CSF COLORLESS COLORLESS   Appearance, CSF CLEAR (A) CLEAR   Supernatant NOT INDICATED    RBC Count, CSF 4 (H) 0 /cu mm   WBC, CSF 79 (HH) 0 - 5 /cu mm    Comment: CRITICAL RESULT CALLED TO, READ BACK BY AND VERIFIED WITH: REESE,E MD @ 1852 ON 01/19/2019 BY TEMOCHE, H    Segmented Neutrophils-CSF 35 (H) 0 - 6 %   Lymphs, CSF 40 40 - 80 %   Monocyte-Macrophage-Spinal Fluid 25 15 - 45 %    Comment: Performed at [facility], [address] [place], [place] [address]  CSF culture     Status: None (Preliminary result)   Collection Time: 01/19/19  4:54 PM   Specimen: Back; Cerebrospinal Fluid  Result Value Ref Range   Specimen Description BACK    Special Requests NONE    Gram Stain      WBC PRESENT,BOTH PMN AND MONONUCLEAR NO ORGANISMS SEEN CYTOSPIN SMEAR Performed at [facility], [address] [place], [place] [address]    Culture PENDING    Report Status PENDING   Glucose, CSF     Status: None   Collection Time: 01/19/19  4:54 PM  Result Value Ref Range   Glucose, CSF 69 40 - 70 mg/dL    Comment: Performed at [facility], [address] [place], [place] [address]  Protein, CSF     Status: Abnormal   Collection Time: 01/19/19  4:54 PM  Result Value Ref Range   Total  Protein, CSF 47 (H) 15 - 45 mg/dL    Comment: Performed at [facility], [address], [place], [place] [address]  Cryptococcal antigen, CSF     Status: None   Collection Time: 01/19/19  4:54 PM  Result Value Ref Range   Crypto Ag NEGATIVE NEGATIVE   Cryptococcal Ag Titer NOT INDICATED NOT INDICATED    Comment: Performed at [facility], [address], [place], [place] [address]   Ct Head Wo Contrast  Result Date: 01/19/2019 CLINICAL DATA:  Severe headache and neck pain for 5 days. EXAM: CT HEAD WITHOUT CONTRAST TECHNIQUE:  Contiguous axial images were obtained from the base of the skull through the vertex without intravenous contrast. COMPARISON:  None. FINDINGS: Brain: No evidence of acute infarction, hemorrhage, hydrocephalus, extra-axial collection or mass lesion/mass effect. Vascular: No hyperdense vessel or unexpected calcification. Skull: Normal. Negative for fracture or focal lesion. Sinuses/Orbits: Polypoid mucosal thickening of the maxillary, ethmoid and sphenoid sinuses. Mastoid air cells are clear. Other: None. IMPRESSION: 1. No acute intracranial abnormality. 2. Chronic pansinusitis. Electronically Signed   By: Ted Mcalpine M.D.   On: 01/19/2019 15:12   Dg Chest Port 1 View  Result Date: 01/19/2019 CLINICAL DATA:  Fever. EXAM: PORTABLE CHEST 1 VIEW COMPARISON:  09/18/2018 chest radiograph FINDINGS: The cardiomediastinal silhouette is unremarkable. There is no evidence of focal airspace disease, pulmonary edema, suspicious pulmonary nodule/mass, pleural effusion, or pneumothorax. No acute bony abnormalities are identified. IMPRESSION: No active disease. Electronically Signed   By: Harmon Pier M.D.   On: 01/19/2019 13:59    Pending Labs Unresulted Labs (From admission, onward)    Start     Ordered   01/19/19 1701  HIV antibody  Once,   STAT     01/19/19 1700   01/19/19 1649  Herpes simplex virus (HSV), DNA by PCR Cerebrospinal Fluid  [facility] ED ADULT CSF PANEL)  ONCE - STAT,   STAT     01/19/19 1649   01/19/19 1649  Arbovirus IgG, CSF  ([facility] ED ADULT CSF PANEL)  ONCE - STAT,   STAT     01/19/19 1649   01/19/19 1649  Hsv Culture And Typing  [facility] ED ADULT CSF PANEL)  ONCE - STAT,   STAT     01/19/19 1649   01/19/19 1440  Ehrlichia antibody panel  Once,   R     01/19/19 1440   01/19/19 1440  Lyme disease, western blot  Once,   R     01/19/19 1440   01/19/19 1440  Rocky mtn spotted fvr abs pnl(IgG+IgM)  Once,   R     01/19/19 1440   Pending  CBC  (enoxaparin (LOVENOX)    CrCl >/= 30 ml/min)  Once,   R     Comments: Baseline for enoxaparin therapy IF NOT ALREADY DRAWN.  Notify MD if PLT < 100 K.    Pending   Pending  Creatinine, serum  (enoxaparin (LOVENOX)    CrCl >/= 30 ml/min)  Once,   R    Comments: Baseline for enoxaparin therapy IF NOT ALREADY DRAWN.    Pending   Pending  Creatinine, serum  (enoxaparin (LOVENOX)    CrCl >/= 30 ml/min)  Weekly,   R    Comments: while on enoxaparin therapy    Pending   Pending  CBC  Tomorrow morning,   R     Pending   Pending  Basic metabolic panel  Tomorrow morning,   R     Pending          Vitals/Pain  Today's Vitals   01/19/19 1531 01/19/19 1600 01/19/19 1800 01/19/19 1802  BP:  133/85    Pulse:  87 80   Temp:      TempSrc:      SpO2:  97% 98%   PainSc: 10-Worst pain ever   5     Isolation Precautions Droplet and Contact precautions  Medications Medications  vancomycin (VANCOCIN) IVPB 1000 mg/200 mL premix (has no administration in time range)  sodium chloride 0.9 % bolus 1,000 mL (0 mLs Intravenous Stopped 01/19/19 1529)  prochlorperazine (COMPAZINE) injection 10 mg (10 mg Intravenous Given 01/19/19 1521)  diphenhydrAMINE (BENADRYL) injection 12.5 mg (12.5 mg Intravenous Given 01/19/19 1521)  lidocaine (PF) (XYLOCAINE) 1 % injection 5 mL (5 mLs Intradermal Given by Other 01/19/19 1733)  LORazepam (ATIVAN) injection 1 mg (1 mg Intravenous Given 01/19/19 1554)  fentaNYL (SUBLIMAZE) injection 50 mcg (50 mcg Intravenous Given 01/19/19 1554)  acetaminophen (TYLENOL) tablet 1,000 mg (1,000 mg Oral Given 01/19/19 1756)  sodium chloride 0.9 % bolus 1,000 mL (1,000 mLs Intravenous New Bag/Given 01/19/19 1754)  cefTRIAXone (ROCEPHIN) 2 g in sodium chloride 0.9 % 100 mL IVPB (0 g Intravenous Stopped 01/19/19 1830)  dexamethasone (DECADRON) injection 10 mg (10 mg Intravenous Given 01/19/19 1755)  doxycycline (VIBRA-TABS) tablet 100 mg (100 mg Oral Given 01/19/19 1756)    Mobility walks Low fall risk   Focused Assessments Pt has presented with  generalized headache that has been progressive.    R Recommendations: See Admitting Provider Note  Report given to:   Additional Notes:

## 2019-01-19 NOTE — ED Notes (Signed)
[phone] Eugene Wiley gf has questions about care. Pt asked RN to call

## 2019-01-19 NOTE — ED Notes (Signed)
Patient transported to CT 

## 2019-01-19 NOTE — ED Notes (Signed)
Consent obtained for LP at bedside

## 2019-01-19 NOTE — ED Notes (Signed)
LP kit and lidocaine at bedside

## 2019-01-19 NOTE — ED Notes (Signed)
Gf states pt has had SOB while sleeping

## 2019-01-19 NOTE — H&P (Signed)
Date: 01/19/2019               Patient Name:  Eugene Wiley MRN: [id number]  DOB: 03/16/1986 Age / Sex: 33 y.o., male   PCP: [facility] Service: Internal Medicine Teaching Service         Attending Physician: Dr. Aldine Contes, MD    First Contact: Dr. Myrtie Hawk  Pager: [phone]  Second Contact: Dr. Maricela Bo  Pager: [phone]       After Hours (After 5p/  First Contact Pager: [phone]  weekends / holidays): Second Contact Pager: [phone]   Chief Complaint: fever, headache   History of Present Illness: Eugene Wiley is a 33 y/o otherwise healthy gentleman who presents with 6 day history of severe headache. Headache was sudden onset and described as diffuse and throbbing. Also complains of associated photophobia, bilateral neck pain and fevers that began on Wednesday (T max 104). He has had decreased appetite with a single episode of nausea and vomiting.  He presented to urgent care on Thursday and had COVID testing done. Received IM injection for headache which did not improve his symptoms. He has also been taking Tylenol and Ibuprofen at home with minimal relief.   He denies history of similar symptoms. No recent sick contacts. Denies numbness/tingling, weakness, vision changes, nasal congestion, sore throat, cough, chest pain, palpitations, abdominal pain, diarrhea, constipation, or urinary symptoms.   On arrival to the ED he was febrile to 103, but otherwise hemodynamically stable. His headache has resolved since receiving medications and undergoing lumbar puncture.    Meds:  Current Meds  Medication Sig  . acetaminophen (TYLENOL) 325 MG tablet Take 325 mg by mouth every 6 (six) hours as needed for fever or headache (pain).  Marland Kitchen ibuprofen (ADVIL) 200 MG tablet Take 400 mg by mouth every 6 (six) hours as needed for fever or headache (pain).  . Multiple Vitamin (MULTIVITAMIN WITH MINERALS) TABS tablet Take 1 tablet by mouth daily.     Allergies: Allergies as of 01/19/2019  . (No Known Allergies)   No past medical history on file.  Family History: Dad and sister with MS  Social History: works as a Administrator for [employer], mostly local. Does not travel outside of [place] and [place]. No tobacco, alcohol or illicit drug use.   Review of Systems: A complete ROS was negative except as per HPI.   Physical Exam: Blood pressure 133/85, pulse 85, temperature (!) 103 F (39.4 C), temperature source Oral, SpO2 100 %. Physical Exam Constitutional:      General: He is not in acute distress.    Appearance: He is well-developed. He is not ill-appearing.  HENT:     Head: Normocephalic and atraumatic.  Neck:     Musculoskeletal: Normal range of motion. No neck rigidity.  Cardiovascular:     Rate and Rhythm: Normal rate and regular rhythm.  Pulmonary:     Effort: Pulmonary effort is normal.     Breath sounds: Normal breath sounds.  Abdominal:     General: Bowel sounds are normal.     Palpations: Abdomen is soft.     Tenderness: There is no abdominal tenderness.  Musculoskeletal:        General: No swelling.  Skin:    General: Skin is warm and dry.  Neurological:     Mental Status: He is alert and oriented to person, place, and time.  Comments: No focal deficits.   Psychiatric:        Mood and Affect: Mood normal.        Behavior: Behavior normal.    CXR: personally reviewed my interpretation is normal lungs volumes. No effusion, pneumothorax or focal infiltrate.   Assessment & Plan by Problem: Active Problems:   Meningitis  Eugene Wiley is 33 y/o otherwise healthy gentleman who is presenting with 6 days of acute onset constant, severe headache with associated photophobia, fevers and neck pain. On presentation, he was febrile to 103, but otherwise hemodynamically stable. He was awake and alert without neurologic findings. CBC with normal white count. CMP significant for creatinine of 1.49, normal lytes and liver  function. Lactic acid normal. Due to signs and symptoms concerning for meningitis, lumbar puncture was performed which revealed elevated opening pressure of 29, although patient's knees were bent. White count elevated at 79 with neutrophil predominance, protein mildly elevated at 47, glucose normal, no organisms seen on gram stain, crypto negative. HSV pending. Tick-borne disease work-up pending. He was started on Ceftriaxone, Vanc, doxycycline. Also given Decadron.   1. Aseptic meningitis - patient clinically improved on evaluation with resolution of headache -  CSF analysis with negative culture, elevated white count, normal glucose and mildly elevated protein consistent with viral meningitis. However, differential is neutrophil predominant.  - continuing on antibiotics for empiric coverage for now; did receive dose of decadron - continue supportive care with fluids and analgesics if headache returns  2. AKI - creatinine on admission 1.49 which is up from last known baseline of < 1 - likely prerenal due to poor PO intake, n/v for the last week  - received 2L NS in ED - recheck BMP in the morning    Diet: regular DVT ppx: Lovenox  CODE: FULL  Dispo: Admit patient to Observation with expected length of stay less than 2 midnights.  SignedBridget Hartshorn: Liyat Faulkenberry D, DO 01/19/2019, 8:16 PM  Pager: [phone]

## 2019-01-19 NOTE — ED Provider Notes (Signed)
[facility] Provider Note   CSN: [id number] Arrival date & time: 01/19/19  1051    History   Chief Complaint Chief Complaint  Patient presents with  . Headache  . Fever    HPI Eugene Wiley is a 33 y.o. male with a hx of no major medical problems presents to the Emergency Department complaining of gradual, persistent, progressively worsening severe, 10/10 headache onset 2 days ago.  Patient reports it began while he was out washing his car; denies trauma or hitting his head.  Was no syncope.  He denies sudden onset, thunderclap.  He does report this is the worst headache of his life.  He has associated photophobia but no blurred vision or diplopia.  Vision changes and fatigue do not change throughout the day.  He reports subjective fevers Monday and Tuesday but measured fevers beginning on Wednesday.  He also endorses associated severe fatigue, myalgias and mild cough.  Reports taking Tylenol and Motrin without any improvement in fever or headache.  Reports he was seen at an urgent care 3 days ago and was swabbed for COVID however he has not received the results.  No chest x-ray was taken.  Patient denies known sick contacts but is a [employer] architecttruck driver.  He reports associated left-sided neck pain but no neck stiffness.  He denies known tick bites or rash.  History and only gathered from patient's girlfriend.  She reports she is a PUI for COVID has had cough and shortness of breath over the last few days.  Friend reports that patient has been "breathing strangely" and seemingly short of breath.     The history is provided by the patient, medical records and a significant other. No language interpreter was used.    No past medical history on file.  Patient Active Problem List   Diagnosis Date Noted  . Hematuria 08/23/2015    No past surgical history on file.      Home Medications    Prior to Admission medications   Medication Sig Start Date  End Date Taking? Authorizing Provider  meloxicam (MOBIC) 15 MG tablet Take 1 tablet (15 mg total) by mouth daily. 08/06/15   Ethelda ChickSmith, Kristi M, MD  orphenadrine (NORFLEX) 100 MG tablet Take 100 mg by mouth 2 (two) times daily.    [provider]    Family History No family history on file.  Social History Social History   Tobacco Use  . Smoking status: Never Smoker  Substance Use Topics  . Alcohol use: Yes    Comment: socially  . Drug use: No     Allergies   Patient has no known allergies.   Review of Systems Review of Systems  Constitutional: Positive for chills, fatigue and fever. Negative for appetite change, diaphoresis and unexpected weight change.  HENT: Negative for mouth sores.   Eyes: Negative for visual disturbance.  Respiratory: Negative for cough, chest tightness, shortness of breath and wheezing.   Cardiovascular: Negative for chest pain.  Gastrointestinal: Negative for abdominal pain, constipation, diarrhea, nausea and vomiting.  Endocrine: Negative for polydipsia, polyphagia and polyuria.  Genitourinary: Negative for dysuria, frequency, hematuria and urgency.  Musculoskeletal: Positive for neck pain. Negative for back pain and neck stiffness.  Skin: Negative for rash.  Allergic/Immunologic: Negative for immunocompromised state.  Neurological: Positive for headaches. Negative for syncope and light-headedness.  Hematological: Does not bruise/bleed easily.  Psychiatric/Behavioral: Negative for sleep disturbance. The patient is not nervous/anxious.  Physical Exam Updated Vital Signs BP (!) 133/105   Pulse (!) 102   Temp (!) 103 F (39.4 C) (Oral)   SpO2 100%   Physical Exam Vitals signs and nursing note reviewed.  Constitutional:      General: He is not in acute distress.    Appearance: He is well-developed. He is not diaphoretic.  HENT:     Head: Normocephalic and atraumatic.  Eyes:     General: No scleral icterus.    Extraocular  Movements: Extraocular movements intact.     Right eye: No nystagmus.     Left eye: No nystagmus.     Conjunctiva/sclera: Conjunctivae normal.     Pupils: Pupils are equal, round, and reactive to light.     Comments: No horizontal, vertical or rotational nystagmus  Neck:     Musculoskeletal: Normal range of motion and neck supple.     Meningeal: Brudzinski's sign and Kernig's sign absent.     Comments: Full active and passive ROM with mild left sided neck pain No midline or paraspinal tenderness No nuchal rigidity or meningeal signs Cardiovascular:     Rate and Rhythm: Regular rhythm. Tachycardia present.     Pulses:          Radial pulses are 2+ on the right side and 2+ on the left side.  Pulmonary:     Effort: Pulmonary effort is normal. No respiratory distress.  Abdominal:     Palpations: Abdomen is soft.  Musculoskeletal: Normal range of motion.     Comments: Range of motion of all major joints.  Lymphadenopathy:     Head:     Right side of head: No submental, submandibular, tonsillar, preauricular, posterior auricular or occipital adenopathy.     Left side of head: No submental, submandibular, tonsillar, preauricular, posterior auricular or occipital adenopathy.     Cervical: No cervical adenopathy.     Right cervical: No superficial, deep or posterior cervical adenopathy.    Left cervical: No superficial, deep or posterior cervical adenopathy.     Upper Body:     Right upper body: No supraclavicular adenopathy.     Left upper body: No supraclavicular adenopathy.  Skin:    General: Skin is warm and dry.     Findings: No rash.     Comments: Hot to touch  Neurological:     Mental Status: He is alert and oriented to person, place, and time.     Cranial Nerves: No cranial nerve deficit.     Motor: No abnormal muscle tone.     Coordination: Coordination normal.     Comments: Mental Status:  Alert, oriented, thought content appropriate. Speech fluent without evidence of  aphasia. Able to follow 2 step commands without difficulty.  Cranial Nerves:  II:  Peripheral visual fields grossly normal, pupils equal, round, reactive to light III,IV, VI: ptosis not present, extra-ocular motions intact bilaterally  V,VII: smile symmetric, facial light touch sensation equal VIII: hearing grossly normal bilaterally  IX,X: midline uvula rise  XI: bilateral shoulder shrug equal and strong XII: midline tongue extension  Motor:  5/5 in upper and lower extremities bilaterally including strong and equal grip strength and dorsiflexion/plantar flexion Sensory: Light touch normal in all extremities.  Cerebellar: normal finger-to-nose with bilateral upper extremities Gait: normal gait and balance CV: distal pulses palpable throughout   Psychiatric:        Behavior: Behavior normal.        Thought Content: Thought content normal.  Judgment: Judgment normal.      ED Treatments / Results  Labs (all labs ordered are listed, but only abnormal results are displayed) Labs Reviewed  CBC WITH DIFFERENTIAL/PLATELET - Abnormal; Notable for the following components:      Result Value   Hemoglobin 12.9 (*)    All other components within normal limits  COMPREHENSIVE METABOLIC PANEL - Abnormal; Notable for the following components:   Glucose, Bld 133 (*)    Creatinine, Ser 1.49 (*)    Albumin 3.4 (*)    ALT 57 (*)    All other components within normal limits  URINALYSIS, ROUTINE W REFLEX MICROSCOPIC - Abnormal; Notable for the following components:   Hgb urine dipstick MODERATE (*)    Protein, ur 100 (*)    RBC / HPF >50 (*)    All other components within normal limits  CSF CELL COUNT WITH DIFFERENTIAL - Abnormal; Notable for the following components:   Appearance, CSF CLEAR (*)    RBC Count, CSF 29 (*)    WBC, CSF 77 (*)    Segmented Neutrophils-CSF 40 (*)    Monocyte-Macrophage-Spinal Fluid 9 (*)    All other components within normal limits  CSF CELL COUNT WITH  DIFFERENTIAL - Abnormal; Notable for the following components:   Appearance, CSF CLEAR (*)    RBC Count, CSF 4 (*)    WBC, CSF 79 (*)    Segmented Neutrophils-CSF 35 (*)    All other components within normal limits  PROTEIN, CSF - Abnormal; Notable for the following components:   Total  Protein, CSF 47 (*)    All other components within normal limits  SARS CORONAVIRUS 2 (HOSPITAL ORDER, PERFORMED IN [facility])  CSF CULTURE  HSV CULTURE AND TYPING  LACTIC ACID, PLASMA  GLUCOSE, CSF  CRYPTOCOCCAL ANTIGEN, CSF  EHRLICHIA ANTIBODY PANEL  LYME DISEASE, WESTERN BLOT  ROCKY MTN SPOTTED FVR ABS PNL(IGG+IGM)  HERPES SIMPLEX VIRUS(HSV) DNA BY PCR  ARBOVIRUS IGG, CSF  HIV ANTIBODY (ROUTINE TESTING W REFLEX)     Radiology Ct Head Wo Contrast  Result Date: 01/19/2019 CLINICAL DATA:  Severe headache and neck pain for 5 days. EXAM: CT HEAD WITHOUT CONTRAST TECHNIQUE: Contiguous axial images were obtained from the base of the skull through the vertex without intravenous contrast. COMPARISON:  None. FINDINGS: Brain: No evidence of acute infarction, hemorrhage, hydrocephalus, extra-axial collection or mass lesion/mass effect. Vascular: No hyperdense vessel or unexpected calcification. Skull: Normal. Negative for fracture or focal lesion. Sinuses/Orbits: Polypoid mucosal thickening of the maxillary, ethmoid and sphenoid sinuses. Mastoid air cells are clear. Other: None. IMPRESSION: 1. No acute intracranial abnormality. 2. Chronic pansinusitis. Electronically Signed   By: Ted Mcalpineobrinka  Dimitrova M.D.   On: 01/19/2019 15:12   Dg Chest Port 1 View  Result Date: 01/19/2019 CLINICAL DATA:  Fever. EXAM: PORTABLE CHEST 1 VIEW COMPARISON:  09/18/2018 chest radiograph FINDINGS: The cardiomediastinal silhouette is unremarkable. There is no evidence of focal airspace disease, pulmonary edema, suspicious pulmonary nodule/mass, pleural effusion, or pneumothorax. No acute bony abnormalities are identified.  IMPRESSION: No active disease. Electronically Signed   By: Harmon PierJeffrey  Hu M.D.   On: 01/19/2019 13:59    Procedures Procedures (including critical care time)  Medications Ordered in ED Medications  vancomycin (VANCOCIN) IVPB 1000 mg/200 mL premix (has no administration in time range)  sodium chloride 0.9 % bolus 1,000 mL (0 mLs Intravenous Stopped 01/19/19 1529)  prochlorperazine (COMPAZINE) injection 10 mg (10 mg Intravenous Given 01/19/19 1521)  diphenhydrAMINE (BENADRYL)  injection 12.5 mg (12.5 mg Intravenous Given 01/19/19 1521)  lidocaine (PF) (XYLOCAINE) 1 % injection 5 mL (5 mLs Intradermal Given by Other 01/19/19 1733)  LORazepam (ATIVAN) injection 1 mg (1 mg Intravenous Given 01/19/19 1554)  fentaNYL (SUBLIMAZE) injection 50 mcg (50 mcg Intravenous Given 01/19/19 1554)  acetaminophen (TYLENOL) tablet 1,000 mg (1,000 mg Oral Given 01/19/19 1756)  sodium chloride 0.9 % bolus 1,000 mL (1,000 mLs Intravenous New Bag/Given 01/19/19 1754)  cefTRIAXone (ROCEPHIN) 2 g in sodium chloride 0.9 % 100 mL IVPB (0 g Intravenous Stopped 01/19/19 1830)  dexamethasone (DECADRON) injection 10 mg (10 mg Intravenous Given 01/19/19 1755)  doxycycline (VIBRA-TABS) tablet 100 mg (100 mg Oral Given 01/19/19 1756)     Initial Impression / Assessment and Plan / ED Course  I have reviewed the triage vital signs and the nursing notes.  Pertinent labs & imaging results that were available during my care of the patient were reviewed by me and considered in my medical decision making (see chart for details).  Clinical Course as of Jan 19 1911  Sat Jan 19, 2019  1300 WNL  WBC: 8.2 [HM]  1301 Mild anemia - slightly below baseline  Hemoglobin(!): 12.9 [HM]  1301 Febrile and mild tachycardia.  Pt given Tylenol 1000mg  at home at 9am.  Fluids being given.  Temp(!): 103 F (39.4 C) [HM]  1322 Creatinine(!): 1.49 [HM]  1322 Elevated from baseline.  Fluids being given  Creatinine(!): 1.49 [HM]  1447 SARS Coronavirus 2:  NEGATIVE [HM]  1655 61mL initially taken off, with pressure of 29. Additional 76mL taken off with closing pressure of 24.     [HM]  1702 Discussed with Dr. Rory Percy who is feels this w/u is broad enough; will not evaluate at this time, but hospitalist is welcome to reconsult if neurologic signs develop.     [HM]  1906 Discussed with Internal Medicine Teaching Service who will admit.     [HM]  1907 Crypto Ag: NEGATIVE [HM]  1907 Elevated - gram stain pending  WBC, CSF(!!): 79 [HM]    Clinical Course User Index [HM] Shaneka Efaw, Jarrett Soho, PA-C        Patient presents with headache and fever for 6 days.  Some mild left-sided neck pain but no nuchal rigidity or stiffness.  Patient has had questionable exposure to COVID.  Concern for possible COVID versus tickborne illness versus meningitis.  Will begin with lab work, CT scan and chest x-ray in addition to COVID testing.  If these are negative, patient will need lumbar puncture.  3:44 PM Continues to have severe headache despite Compazine and Benadryl being given.  CT scan is without acute abnormality including no intracranial hemorrhage or signs of subacute stroke.  Labs are reassuring.  COVID-19 test negative.  I have significant concern for potential meningitis at this time.  Risk and benefit of LP discussed with patient.  He agrees to the procedure and consent form signed.   7:07 PM LP without complication.  Concern for potential weighted opening pressures however patient's knees were bent at the time.  Normal neurologic exam.  Elevated white blood cell count.  Gram stain pending.  Tickborne illnesses pending.  Patient covered with Rocephin, doxycycline and vancomycin.  Additionally, patient given Decadron.  Discussed with internal medicine teaching service who will admit.  The patient was discussed with and seen by Dr. Ralene Bathe who agrees with the treatment plan.  Eugene Wiley was evaluated in Emergency Department on 01/19/2019 for the  symptoms described in  the history of present illness. He was evaluated in the context of the global COVID-19 pandemic, which necessitated consideration that the patient might be at risk for infection with the SARS-CoV-2 virus that causes COVID-19. Institutional protocols and algorithms that pertain to the evaluation of patients at risk for COVID-19 are in a state of rapid change based on information released by regulatory bodies including the CDC and federal and state organizations. These policies and algorithms were followed during the patient's care in the ED.    Final Clinical Impressions(s) / ED Diagnoses   Final diagnoses:  Meningitis  Worst headache of life  Fever, unspecified fever cause    ED Discharge Orders    None       Mardene SayerMuthersbaugh, Boyd KerbsHannah, PA-C 01/19/19 1912    Tilden Fossaees, Elizabeth, MD 01/20/19 1109

## 2019-01-19 NOTE — ED Provider Notes (Signed)
.  Lumbar Puncture  Date/Time: 01/19/2019 4:55 PM Performed by: Quintella Reichert, MD Authorized by: Quintella Reichert, MD   Consent:    Consent obtained:  Verbal   Consent given by:  Patient   Risks discussed:  Bleeding, infection, pain, repeat procedure and headache Pre-procedure details:    Procedure purpose:  Diagnostic Anesthesia (see MAR for exact dosages):    Anesthesia method:  Local infiltration   Local anesthetic:  Lidocaine 1% w/o epi Procedure details:    Lumbar space:  L3-L4 interspace   Patient position:  L lateral decubitus   Needle gauge:  20   Needle length (in):  3.5   Number of attempts:  3   Opening pressure (cm H2O):  29   Fluid appearance:  Clear   Tubes of fluid:  4   Total volume (ml):  17 Post-procedure:    Puncture site:  Direct pressure applied   Patient tolerance of procedure:  Tolerated well, no immediate complications Comments:     Initial attempted LP performed and sitting position. Unable to access CSF. He was repositioned in the left lateral decubitus position with return of CSF on second attempt. Fluid was expressed quickly and after tubes were collected then a pressure was obtained. A pressure of 29 was obtained. Additional fluid was removed and a closing pressure of 24 was obtained.      Quintella Reichert, MD 01/19/19 [phone]

## 2019-01-19 NOTE — ED Notes (Signed)
Updated pt family member on pt status

## 2019-01-19 NOTE — ED Triage Notes (Signed)
Pt in with c/o severe HA with neck pain x 5 days, reported temp of 104.2 PTA. Pt reporting light sensitivity and nausea

## 2019-01-20 ENCOUNTER — Encounter (HOSPITAL_COMMUNITY): Payer: Self-pay

## 2019-01-20 DIAGNOSIS — N179 Acute kidney failure, unspecified: Secondary | ICD-10-CM | POA: Diagnosis not present

## 2019-01-20 DIAGNOSIS — A86 Unspecified viral encephalitis: Secondary | ICD-10-CM | POA: Diagnosis present

## 2019-01-20 DIAGNOSIS — G030 Nonpyogenic meningitis: Secondary | ICD-10-CM | POA: Diagnosis not present

## 2019-01-20 DIAGNOSIS — A879 Viral meningitis, unspecified: Secondary | ICD-10-CM | POA: Diagnosis present

## 2019-01-20 DIAGNOSIS — G039 Meningitis, unspecified: Secondary | ICD-10-CM | POA: Diagnosis present

## 2019-01-20 DIAGNOSIS — Z20828 Contact with and (suspected) exposure to other viral communicable diseases: Secondary | ICD-10-CM | POA: Diagnosis present

## 2019-01-20 DIAGNOSIS — R3129 Other microscopic hematuria: Secondary | ICD-10-CM | POA: Diagnosis present

## 2019-01-20 DIAGNOSIS — J324 Chronic pansinusitis: Secondary | ICD-10-CM | POA: Diagnosis present

## 2019-01-20 DIAGNOSIS — E860 Dehydration: Secondary | ICD-10-CM | POA: Diagnosis present

## 2019-01-20 DIAGNOSIS — Z79899 Other long term (current) drug therapy: Secondary | ICD-10-CM | POA: Diagnosis not present

## 2019-01-20 DIAGNOSIS — N170 Acute kidney failure with tubular necrosis: Secondary | ICD-10-CM | POA: Diagnosis present

## 2019-01-20 LAB — CBC
HCT: 37.1 % — ABNORMAL LOW (ref 39.0–52.0)
Hemoglobin: 12.2 g/dL — ABNORMAL LOW (ref 13.0–17.0)
MCH: 29.1 pg (ref 26.0–34.0)
MCHC: 32.9 g/dL (ref 30.0–36.0)
MCV: 88.5 fL (ref 80.0–100.0)
Platelets: 213 10*3/uL (ref 150–400)
RBC: 4.19 MIL/uL — ABNORMAL LOW (ref 4.22–5.81)
RDW: 11.8 % (ref 11.5–15.5)
WBC: 8.9 10*3/uL (ref 4.0–10.5)
nRBC: 0 % (ref 0.0–0.2)

## 2019-01-20 LAB — BASIC METABOLIC PANEL
Anion gap: 9 (ref 5–15)
BUN: 12 mg/dL (ref 6–20)
CO2: 25 mmol/L (ref 22–32)
Calcium: 8.8 mg/dL — ABNORMAL LOW (ref 8.9–10.3)
Chloride: 104 mmol/L (ref 98–111)
Creatinine, Ser: 1.4 mg/dL — ABNORMAL HIGH (ref 0.61–1.24)
GFR calc Af Amer: >60 mL/min (ref >60)
GFR calc non Af Amer: >60 mL/min (ref >60)
Glucose, Bld: 159 mg/dL — ABNORMAL HIGH (ref 70–99)
Potassium: 4.5 mmol/L (ref 3.5–5.1)
Sodium: 138 mmol/L (ref 135–145)

## 2019-01-20 LAB — HIV ANTIBODY (ROUTINE TESTING W REFLEX): HIV Screen 4th Generation wRfx: NONREACTIVE

## 2019-01-20 MED ORDER — SODIUM CHLORIDE 0.9 % IV SOLN
INTRAVENOUS | Status: DC
  Administered 2019-01-20 – 2019-01-22 (×4): via INTRAVENOUS

## 2019-01-20 MED ORDER — LACTATED RINGERS IV SOLN
INTRAVENOUS | Status: AC
  Administered 2019-01-20: 08:00:00 via INTRAVENOUS

## 2019-01-20 MED ORDER — VANCOMYCIN HCL IN DEXTROSE 1-5 GM/200ML-% IV SOLN
1000.0000 mg | Freq: Three times a day (TID) | INTRAVENOUS | Status: DC
  Administered 2019-01-20 – 2019-01-21 (×2): 1000 mg via INTRAVENOUS
  Filled 2019-01-20 (×3): qty 200

## 2019-01-20 MED ORDER — SODIUM CHLORIDE 0.9 % IV SOLN
2.0000 g | Freq: Two times a day (BID) | INTRAVENOUS | Status: DC
  Administered 2019-01-20 (×2): 2 g via INTRAVENOUS
  Filled 2019-01-20: qty 20
  Filled 2019-01-20: qty 2
  Filled 2019-01-20: qty 20
  Filled 2019-01-20: qty 2

## 2019-01-20 MED ORDER — SODIUM CHLORIDE 0.9 % IV SOLN
INTRAVENOUS | Status: DC | PRN
  Administered 2019-01-20: 250 mL via INTRAVENOUS

## 2019-01-20 MED ORDER — VANCOMYCIN HCL 10 G IV SOLR
1500.0000 mg | INTRAVENOUS | Status: AC
  Administered 2019-01-20: 1500 mg via INTRAVENOUS
  Filled 2019-01-20: qty 1500

## 2019-01-20 MED ORDER — LACTATED RINGERS IV BOLUS
1000.0000 mL | Freq: Once | INTRAVENOUS | Status: AC
  Administered 2019-01-20: 08:00:00 1000 mL via INTRAVENOUS

## 2019-01-20 NOTE — Progress Notes (Signed)
Pharmacy Antibiotic Note  Eugene Wiley is a 33 y.o. male admitted on 01/19/2019 with concern for acute encephalitis/meningitis.  Pharmacy has been consulted for Vancomycin and Ceftriaxone dosing. Received dose of Vancomycin, Ceftriaxone, and Doxycycline in ED yesterday.   Plan: Ceftriaxone 2g IV q12h Vancomycin 1500mg  IV x 1 now, then 1g IV q8h Monitor renal function, cultures, clinical course   Height: 6\' 1"  (185.4 cm) Weight: 217 lb 13 oz (98.8 kg) IBW/kg (Calculated) : 79.9  Temp (24hrs), Avg:99.9 F (37.7 C), Min:99.1 F (37.3 C), Max:100.3 F (37.9 C)  Recent Labs  Lab 01/19/19 1205 01/20/19 0237  WBC 8.2 8.9  CREATININE 1.49* 1.40*  LATICACIDVEN 1.4  --     Estimated Creatinine Clearance: 93.8 mL/min (A) (by C-G formula based on SCr of 1.4 mg/dL (H)).    No Known Allergies  Antimicrobials this admission: 6/20 Doxycycline x 1 6/20 Ceftriaxone >>  6/20 Vancomycin >>   Microbiology results: 6/20 CSF cx: NGTD 6/20 HSV: sent 6/20 Cryptococcal antigen: negative  6/20 COVID: negative  6/21 HIV antibody: IP 6/21 Rocky mtn spotted fever (IgG + IgM): IP  6/21 Lyme disease: IP 9/89 Ehrlichia antibody panel: IP    Thank you for allowing pharmacy to be a part of this patient's care.    Lindell Spar, PharmD, BCPS Clinical Pharmacist  01/20/2019 11:57 AM

## 2019-01-20 NOTE — Discharge Instructions (Addendum)
It was a pleasure to take care of you Eugene Wiley! During your hospitalization you were taken care of for an infection in your cerebrospinal fluid.

## 2019-01-20 NOTE — Progress Notes (Signed)
   Subjective:   Eugene Wiley was seen laying in his bed this morning and he stated that he was feeling well. He has not gotten up and walked around yet.   Objective:  Vital signs in last 24 hours: Vitals:   01/19/19 2230 01/19/19 2245 01/19/19 2306 01/20/19 0508  BP: (!) 145/74 130/80 (!) 140/93 (!) 136/93  Pulse: 68 64 65 77  Resp:   19 17  Temp:   100.2 F (37.9 C) 99.1 F (37.3 C)  TempSrc:   Oral Oral  SpO2: 98% 100% 100% 100%   Physical Exam  Constitutional: Appears well-developed and well-nourished. No distress.  HENT:  Head: Normocephalic and atraumatic.  Eyes: Conjunctivae are normal.  Neck: no nuchal rigidity Cardiovascular: Normal rate, regular rhythm and normal heart sounds.  Respiratory: Effort normal and breath sounds normal. No respiratory distress. No wheezes.  GI: Soft. Bowel sounds are normal. No distension. There is no tenderness.  Musculoskeletal: No edema.  Neurological: Is alert, oriented Skin: Not diaphoretic. No erythema.  Psychiatric: Normal mood and affect. Behavior is normal. Judgment and thought content normal.   Assessment/Plan:  Ms. Teegarden is a 33 y.o m with no sig pmh who presented with 6 day hx of severe headache, photophobia, fever (104.2), and neck pain being treated for acute encephalitis.   Acute viral encephalitis  Patient is post lumbar puncture that is consistent with viral meningitis. Treated empirically with ceftriaxone, vanomycin, doxycycline. Patient's temperatures has decreased to 99-100 without leukocytosis.   Lumbar puncture:  Total protein CSF 47, glucose csf 69 (serum 133) OP elevated  CSF cell count with diff: Neutrophil predominance pleocytosis  Tube 1: colorless, 29 rbc, 77 wbc, 40 seg neutrophils, 51 lymph, 9 monocyte Tube 4: colorless, 4 rbc, 79 wbc, 35 seg neutrophils, 40 lymph, neg culture HSV pending  Tickborne disease (ehrlichia, lyme, rocky mtn) workup pending  -pending hsv culture and typing -pending CSF  cultures- no growth<24 hrs -negative cryptococcal ag -requested nurse to ambulate patient  -Consulted infectious disease who recommended monitoring patient for 48 hrs, do a stepwise taper of abx.  -continue vancomycin (day 2) -continue ceftriaxone (day 2) -Will remove doxycycline today 6/21  Acute kidney injury  Patient's creatinine improved to 1.4 from 1.49.   -1L LR bolus post LR mainenance 176ml/hr  Dispo: Anticipated discharge in approximately 0-1 day(s).   Lars Mage, MD 01/20/2019, 7:13 AM Pager: [phone]

## 2019-01-20 NOTE — Progress Notes (Signed)
Patient describes walking like a shock to his head. But once he sat down it feels better.

## 2019-01-20 NOTE — Progress Notes (Signed)
Received pt alert and oriented x4. Ambulatory. Pt denies pain at this time. Still having high temp at 100.9. pt denies chills. Oriented to plan of care and oriented pt to room and use of call light.

## 2019-01-21 ENCOUNTER — Inpatient Hospital Stay (HOSPITAL_COMMUNITY): Payer: Managed Care, Other (non HMO)

## 2019-01-21 ENCOUNTER — Encounter (HOSPITAL_COMMUNITY): Payer: Self-pay | Admitting: General Practice

## 2019-01-21 DIAGNOSIS — G030 Nonpyogenic meningitis: Secondary | ICD-10-CM

## 2019-01-21 LAB — CREATININE, URINE, RANDOM: Creatinine, Urine: 68.13 mg/dL

## 2019-01-21 LAB — CREATININE, SERUM
Creatinine, Ser: 1.56 mg/dL — ABNORMAL HIGH (ref 0.61–1.24)
GFR calc Af Amer: >60 mL/min (ref >60)
GFR calc non Af Amer: 58 mL/min — ABNORMAL LOW (ref >60)

## 2019-01-21 LAB — URINALYSIS, ROUTINE W REFLEX MICROSCOPIC
Bacteria, UA: NONE SEEN
Bilirubin Urine: NEGATIVE
Glucose, UA: NEGATIVE mg/dL
Ketones, ur: NEGATIVE mg/dL
Leukocytes,Ua: NEGATIVE
Nitrite: NEGATIVE
Protein, ur: NEGATIVE mg/dL
Specific Gravity, Urine: 1.008 (ref 1.005–1.030)
pH: 6 (ref 5.0–8.0)

## 2019-01-21 LAB — SODIUM, URINE, RANDOM: Sodium, Ur: 114 mmol/L

## 2019-01-21 LAB — HSV CULTURE AND TYPING

## 2019-01-21 LAB — PROTEIN / CREATININE RATIO, URINE
Creatinine, Urine: 69.79 mg/dL
Protein Creatinine Ratio: 0.32 mg/mg{Cre} — ABNORMAL HIGH (ref 0.00–0.15)
Total Protein, Urine: 22 mg/dL

## 2019-01-21 NOTE — Consult Note (Signed)
[facility] for Infectious Disease    Date of Admission:  01/19/2019     Total days of antibiotics 2               Reason for Consult: Meningitis   Referring Provider: Dareen Piano Primary Care Provider: [facility]   Assessment/Plan:  Eugene Wiley is a 33 y/o male with no significant medical history presenting with acute headache and fever with symptoms consistent with aspetic meningitis as imaging without acute intracranial process. CSF shows mild pleocytosis with neutrophil predominance, however glucose and protein are normal. CSF cultures negative in <24 hours.  Aseptic meningitis - Does not appear to be bacterial and more consistent with viral origin and suspect enterovirus in the summer months to be predominate. Will also add-on VDRL to rule out neurosyphilis. Will continue to monitor HSV, Ehrlichia, and Arbovirus results. Discontinue antibiotics and monitor.    Active Problems:   Meningitis   . enoxaparin (LOVENOX) injection  40 mg Subcutaneous Q24H  . sodium chloride flush  3 mL Intravenous Q12H     HPI: Eugene Wiley is a 33 y.o. male with no significant past medical history admitted with gradual onset, worsening and severe (10/10) headache starting 2 days prior to presentation when he was washing his car. No trauma or injury. Reported as the worst headache of his life with associated photophobia. Subjective fevers initially and measured fever on Wednesday. Other symptoms included severe fatigue, myalgias, and mild cough.Symptoms were refractory to OTC medications including Tylenol and Motrin. Initially seen at Urgent Care and tested for Sars-Cov-2 as his girlfriend is  PUI with cough and shortness of breath.    Initially febrile with a temperature of 103. Chest x-ray without active cardiopulmonary disease with CT head showing no acute intracranial abnormality with chronic pansinusitis. Lab work with WBC count of 8.2, Sars-Cov-2 negative,  and creatinine 1.5 (baseline <1). LP performed with concern for meningitis with CSF showing WBC 77, neutrophils 40, lymphocytes 51, RBC 29, protein 47  and glucose of 69 (serum 133). CSF cultures are without growth in <24 hours. Cryptococcal antigen negative. HSV and arbovirus pending. HIV antibody negative. Also tested for vector borne illness. Started on vancomycin and ceftriaxone and received 1 dose of doxycycline.   Since admission Eugene Wiley has had elevated temperatures with max temperature in the last 24 hours of 100.8 and appears to be down trending with range of 99.1-100.8. Blood pressures slightly elevated. Currently on Day 3 of antimicrobial therapy with vancomycin and ceftriaxone.   Review of Systems: Review of Systems  Constitutional: Positive for fever. Negative for chills and weight loss.  Respiratory: Negative for cough, shortness of breath and wheezing.   Cardiovascular: Negative for chest pain and leg swelling.  Gastrointestinal: Negative for abdominal pain, constipation, diarrhea, nausea and vomiting.  Musculoskeletal: Positive for neck pain.  Skin: Negative for rash.     History reviewed. No pertinent past medical history.  Social History   Tobacco Use  . Smoking status: Never Smoker  Substance Use Topics  . Alcohol use: Yes    Comment: socially  . Drug use: No    No family history on file.  No Known Allergies  OBJECTIVE: Blood pressure 132/84, pulse 86, temperature (!) 100.4 F (38 C), temperature source Oral, resp. rate 18, height 6\' 1"  (1.854 m), weight 98.8 kg, SpO2 100 %.  Physical Exam Constitutional:      General: He is not in acute distress.  Appearance: He is well-developed.  Eyes:     General: No visual field deficit.    Extraocular Movements: Extraocular movements intact.     Pupils: Pupils are equal, round, and reactive to light.  Neck:     Musculoskeletal: Muscular tenderness (Left side upper trapezius) present. No neck rigidity.   Cardiovascular:     Rate and Rhythm: Normal rate and regular rhythm.     Heart sounds: Normal heart sounds.  Pulmonary:     Effort: Pulmonary effort is normal.     Breath sounds: Normal breath sounds.  Lymphadenopathy:     Cervical: No cervical adenopathy.  Skin:    General: Skin is warm and dry.  Neurological:     General: No focal deficit present.     Mental Status: He is alert and oriented to person, place, and time.     Cranial Nerves: No cranial nerve deficit, dysarthria or facial asymmetry.  Psychiatric:        Mood and Affect: Mood normal.        Behavior: Behavior normal.        Thought Content: Thought content normal.        Judgment: Judgment normal.     Lab Results Lab Results  Component Value Date   WBC 8.9 01/20/2019   HGB 12.2 (L) 01/20/2019   HCT 37.1 (L) 01/20/2019   MCV 88.5 01/20/2019   PLT 213 01/20/2019    Lab Results  Component Value Date   CREATININE 1.56 (H) 01/21/2019   BUN 12 01/20/2019   NA 138 01/20/2019   K 4.5 01/20/2019   CL 104 01/20/2019   CO2 25 01/20/2019    Lab Results  Component Value Date   ALT 57 (H) 01/19/2019   AST 36 01/19/2019   ALKPHOS 76 01/19/2019   BILITOT 1.0 01/19/2019     Microbiology: Recent Results (from the past 240 hour(s))  SARS Coronavirus 2 (CEPHEID- Performed in [facility] hospital lab), Hosp Order     Status: None   Collection Time: 01/19/19 12:46 PM   Specimen: Nasopharyngeal Swab  Result Value Ref Range Status   SARS Coronavirus 2 NEGATIVE NEGATIVE Final    Comment: (NOTE) If result is NEGATIVE SARS-CoV-2 target nucleic acids are NOT DETECTED. The SARS-CoV-2 RNA is generally detectable in upper and lower  respiratory specimens during the acute phase of infection. The lowest  concentration of SARS-CoV-2 viral copies this assay can detect is 250  copies / mL. A negative result does not preclude SARS-CoV-2 infection  and should not be used as the sole basis for treatment or other  patient  management decisions.  A negative result may occur with  improper specimen collection / handling, submission of specimen other  than nasopharyngeal swab, presence of viral mutation(s) within the  areas targeted by this assay, and inadequate number of viral copies  (<250 copies / mL). A negative result must be combined with clinical  observations, patient history, and epidemiological information. If result is POSITIVE SARS-CoV-2 target nucleic acids are DETECTED. The SARS-CoV-2 RNA is generally detectable in upper and lower  respiratory specimens dur ing the acute phase of infection.  Positive  results are indicative of active infection with SARS-CoV-2.  Clinical  correlation with patient history and other diagnostic information is  necessary to determine patient infection status.  Positive results do  not rule out bacterial infection or co-infection with other viruses. If result is PRESUMPTIVE POSTIVE SARS-CoV-2 nucleic acids MAY BE PRESENT.   A presumptive  positive result was obtained on the submitted specimen  and confirmed on repeat testing.  While 2019 novel coronavirus  (SARS-CoV-2) nucleic acids may be present in the submitted sample  additional confirmatory testing may be necessary for epidemiological  and / or clinical management purposes  to differentiate between  SARS-CoV-2 and other Sarbecovirus currently known to infect humans.  If clinically indicated additional testing with an alternate test  methodology [phone]([id number]) is advised. The SARS-CoV-2 RNA is generally  detectable in upper and lower respiratory sp ecimens during the acute  phase of infection. The expected result is Negative. Fact Sheet for Patients:  BoilerBrush.com.cyhttps://www.fda.gov/media/136312/download Fact Sheet for Healthcare Providers: https://pope.com/https://www.fda.gov/media/136313/download This test is not yet approved or cleared by the [place] FDA and has been authorized for detection and/or diagnosis of SARS-CoV-2 by FDA under  an Emergency Use Authorization (EUA).  This EUA will remain in effect (meaning this test can be used) for the duration of the COVID-19 declaration under Section 564(b)(1) of the Act, 21 U.S.C. section 360bbb-3(b)(1), unless the authorization is terminated or revoked sooner. Performed at [facility], [address] [place], [place] [address]   CSF culture     Status: None (Preliminary result)   Collection Time: 01/19/19  4:54 PM   Specimen: Back; Cerebrospinal Fluid  Result Value Ref Range Status   Specimen Description BACK  Final   Special Requests NONE  Final   Gram Stain   Final    WBC PRESENT,BOTH PMN AND MONONUCLEAR NO ORGANISMS SEEN CYTOSPIN SMEAR    Culture   Final    NO GROWTH < 24 HOURS Performed at [facility], [address] [place], [place] [address]    Report Status PENDING  Incomplete     Marcos EkeGreg Talley Casco, NP [facility] [phone] Pager  01/21/2019  8:38 AM

## 2019-01-21 NOTE — Progress Notes (Signed)
   Subjective:  The patient stated that he has not been able to sleep last night thinks it maybe due to his hospital bed also because he had to go to pee every 5 minutes when got IV fluid. No burning when pees. He continues to have headache. He has he also has some mild neck stiffness that again thinks it is because hospital bed. No nausea or vomiting.   Objective:  Vital signs in last 24 hours: Vitals:   01/20/19 1456 01/20/19 2148 01/20/19 2249 01/21/19 0546  BP: (!) 146/96 (!) 135/92  132/84  Pulse: 78 97  86  Resp: 18 18  18   Temp: 99.2 F (37.3 C) (!) 100.8 F (38.2 C) 99.1 F (37.3 C) (!) 100.4 F (38 C)  TempSrc: Oral Oral  Oral  SpO2: 100% 99%  100%  Weight:      Height:       Physical Exam:  VS reviewed, nursing notes reviewed. General: Sitting on the chair, appears comfortable Neck: No neck stiffness CV: RRR, normal S1-S2, no murmur Pulm: CTA bilaterally, no crackles Abdomen: Soft, nontender to palpation Neurologic exam: Alert and oriented x3, no focal neurological deficit Skin: No rash  Assessment/Plan:  Active Problems:   Meningitis  Ms. Anna is a 33 y.o m with no sig pmh who presented with 6 day hx of severe headache, photophobia, fever (104.2), and neck pain being treated for acute encephalitis.   Acute viral encephalitis  Patient had a fever of 100.4 and 100.8 last night.  He still has mild headache but generally doing better.  Patient is post lumbar puncture that is consistent with viral meningitis.  Lumbar puncture: Suggestive of viral HSV pending  Tickborne disease (ehrlichia, lyme, rocky mtn) workup pending pending hsv culture and typing pending CSF cultures- no growth<24 hrs  negative cryptococcal ag Treated empirically with ceftriaxone, vanomycin, doxycycline.  Probably discontinue today.   -requested nurse to ambulate patient  -Infectious disease recommended monitoring patient for 48 hrs, do a stepwise taper of abx.  -On  vancomycin and  Ceftriaxone (day 3). Continue until reevaluated by ID -(Removed doxycycline today 6/21) -Appreciate ID recommendation, may discontinue antibiotic.  Acute kidney injury  Patient's creatinine today up to 1.5 from 1.4 yesterday despite being on IVF. Urine out put  -Continue mainenance 15ml/hr -Renal US -FeNa, Urine Cr -BMP daily  Per patient request and permission, I contacted his girlfriend [phone] and updated her about Mr. Wander.  All of her questions and concerns were addressed.   Dispo: Anticipated discharge in approximately 1-2 today(s).   Dewayne Hatch, MD 01/21/2019, 6:35 AM Pager: [phone]

## 2019-01-22 DIAGNOSIS — R3129 Other microscopic hematuria: Secondary | ICD-10-CM

## 2019-01-22 LAB — BASIC METABOLIC PANEL
Anion gap: 10 (ref 5–15)
BUN: 12 mg/dL (ref 6–20)
CO2: 27 mmol/L (ref 22–32)
Calcium: 8.6 mg/dL — ABNORMAL LOW (ref 8.9–10.3)
Chloride: 101 mmol/L (ref 98–111)
Creatinine, Ser: 1.53 mg/dL — ABNORMAL HIGH (ref 0.61–1.24)
GFR calc Af Amer: >60 mL/min (ref >60)
GFR calc non Af Amer: 59 mL/min — ABNORMAL LOW (ref >60)
Glucose, Bld: 131 mg/dL — ABNORMAL HIGH (ref 70–99)
Potassium: 3.5 mmol/L (ref 3.5–5.1)
Sodium: 138 mmol/L (ref 135–145)

## 2019-01-22 LAB — ROCKY MTN SPOTTED FVR ABS PNL(IGG+IGM)
RMSF IgG: NEGATIVE
RMSF IgM: 0.24 index (ref 0.00–0.89)

## 2019-01-22 LAB — EHRLICHIA ANTIBODY PANEL
E chaffeensis (HGE) Ab, IgG: NEGATIVE
E chaffeensis (HGE) Ab, IgM: NEGATIVE
E. Chaffeensis (HME) IgM Titer: NEGATIVE
E.Chaffeensis (HME) IgG: NEGATIVE

## 2019-01-22 LAB — HEMOGLOBIN A1C
Hgb A1c MFr Bld: 5.4 % (ref 4.8–5.6)
Mean Plasma Glucose: 108 mg/dL

## 2019-01-22 LAB — PATHOLOGIST SMEAR REVIEW

## 2019-01-22 LAB — HSV DNA BY PCR (REFERENCE LAB)
HSV 1 DNA: NEGATIVE
HSV 2 DNA: NEGATIVE

## 2019-01-22 LAB — VDRL, CSF: VDRL Quant, CSF: NONREACTIVE

## 2019-01-22 NOTE — Progress Notes (Addendum)
   Subjective:  Mr. Persky stated that he said that he is doing well.  Denies any fever, nausea, vomiting.  Has a mild pain at left side of his neck but it got better comparing to yesterday.  We discussed his lab results so far.  All of his questions and concerns were addressed.  Objective:  Vital signs in last 24 hours: Vitals:   01/21/19 0546 01/21/19 1302 01/21/19 2233 01/22/19 0546  BP: 132/84 (!) 137/100 125/69 (!) 115/91  Pulse: 86 73 91 81  Resp: 18  16 16   Temp: (!) 100.4 F (38 C) 98.7 F (37.1 C) (!) 100.5 F (38.1 C) 99.3 F (37.4 C)  TempSrc: Oral Oral Oral Oral  SpO2: 100% 100% 98% 100%  Weight:      Height:       General: No acute distress Head and neck: No rigidity CV: RRR, normal S1-S2 Pulmonary exam: CTA bilaterally, no crackle Skin: No rash Neurology exam: Alert and oriented x3  hysical Exam  Assessment/Plan:  Active Problems: Aseptic Meningitis  Ms. Ramthun is a 33 y.o m with no sig pmh who presented with 6 day hx of severe headache, photophobia, fever(104.2), and neck pain being treated for acute encephalitis.   Acute viral encephalitis Patient had a fever of 100.5 last night. Which is low grade and acceptable in setting of viral meningitis. Antibiotic discontinued yesterday. HSV PCR is negative.  CSF cultures- no growth so far negative cryptococcal ag  -Clinically stable to Dc home. Updated pt's girl friend.  Acute kidney injury  Creatinine remains at 1.5, urine ultrasound was unremarkable.  UA showed positive for RBC, slight elevation in protein/creatinine ratio. Unclear how acute it is. Can be mild ATN due to viral meningitis or increased Cr in setting of his high macular mass. No recent BMP to know the base line). His microscopic hematuria is chronic.  -Recommending to f/u with PCP, repeat kidney function and UA and referral as needed.  Dispo: Anticipated discharge today  Dewayne Hatch, MD 01/22/2019, 6:51 AM Pager: @[phone] @

## 2019-01-22 NOTE — Discharge Summary (Addendum)
Name: Eugene Wiley MRN: [id number] DOB: [birth year] 33 y.o. PCP: Kristen Loader, FNP  Date of Admission: 01/19/2019 10:56 AM Date of Discharge: 01/22/2019 Attending Physician: Dr. Dareen Piano Discharge Diagnosis: 1. Active Problems:   Meningitis   Discharge Medications: Allergies as of 01/22/2019   No Known Allergies     Medication List    STOP taking these medications   ibuprofen 200 MG tablet Commonly known as: ADVIL     TAKE these medications   acetaminophen 325 MG tablet Commonly known as: TYLENOL Take 325 mg by mouth every 6 (six) hours as needed for fever or headache (pain).   multivitamin with minerals Tabs tablet Take 1 tablet by mouth daily.       Disposition and follow-up:   [name] was discharged from [facility] in stable condition.  At the hospital follow up visit please address:  1. Patient admitted for aseptic (likeley viral) meningitis. Please reevaluate symptoms at f/u visit.  2. Patient had AKI on arrival, please repeat BMP. He also has microscopic hematuria that reports as a chronic finding. Please consider work up and referal if consistent and necessary.  2.  Labs / imaging needed at time of follow-up: BMP, UA  3.  Pending labs/ test needing follow-up: Lyme disease panel  Follow-up Appointments: Follow-up Information    [facility] [place] in 1 week(s).   Specialty: Family Medicine Contact information: [address]  [place] [address] [phone]           Hospital Course by problem list: 1. Aseptic (likely viral) meningitis:  33 y/o male with no known past medical Hx, presented with headache,neck pain, fever and photophobia for 6 days prior to arrival. Head CT with no acute intracranial abnormality and unremarkable except chronic pansinusitis. LP performed and showed WBC 79, total Pr 47 and glucose of 69, mostly suggestive of viral meningitis. HSV DNA  and PCR was negative. Initially started with Ceftriaxone and Vancomycin and Doxycycline (that then discontinued since no evidence for tickborne disease found.) Infectious disease was following the case. CSF culture remained negative with no growth until discharge and antibiotic therapy dicontinoued. CSF VDRL negative. His symptoms improved. He discharged to continue supportive care for viral meningitis.   2. AKI: Cr 1.49 on arrival. Patient was dehydrated on arrival due to low PO intake and decreased appetite prior to admission. UA showed no Pr but showed moderate RBC. Patient reports chronic microscopic hematuria.  Renal US was unremarkable. He received IV fluid for likely prerenal AKI.  And will follow up with PCP to repeat BMP and UA in a week and be reffered  Discharge Vitals:   BP 137/87 (BP Location: Left Arm)   Pulse 66   Temp 98.7 F (37.1 C) (Oral)   Resp 17   Ht 6\' 1"  (1.854 m)   Wt 217 lb 13 oz (98.8 kg)   SpO2 100%   BMI 28.74 kg/m   Pertinent Labs, Studies, and Procedures:  BMP Latest Ref Rng & Units 01/22/2019 01/21/2019 01/20/2019  Glucose 70 - 99 mg/dL 131(H) - 159(H)  BUN 6 - 20 mg/dL 12 - 12  Creatinine 0.61 - 1.24 mg/dL 1.53(H) 1.56(H) 1.40(H)  Sodium 135 - 145 mmol/L 138 - 138  Potassium 3.5 - 5.1 mmol/L 3.5 - 4.5  Chloride 98 - 111 mmol/L 101 - 104  CO2 22 - 32 mmol/L 27 - 25  Calcium 8.9 - 10.3 mg/dL 8.6(L) - 8.8(L)  Component 6/21  Specimen Description BACK   Special Requests NONE   Gram Stain WBC PRESENT,BOTH PMN AND MONONUCLEAR  NO ORGANISMS SEEN  CYTOSPIN SMEAR   Culture NO GROWTH 3 DAYS  Performed at [facility], [address] [place], [place] [address]   Report Status 01/23/2019 FINAL   Resulting Agency [facility]       Specimen Information: Back; Cerebrospinal Fluid     (important suggestion)  Newer results are available. Click to view them now.  Component 13d ago  HSV Culture/Type Comment   Comment: (NOTE)  Negative  No Herpes  simplex virus isolated.          Ref Range & Units 01/20/2019  VDRL Quant, CSF Non Rea:<1:1 Non Reactive     Ref Range & Units 01/20/2019  RMSF IgG Negative Negative   RMSF IgM 0.00 - 0.89 index 0.24   Comment: (NOTE)    Component     Latest Ref Rng & Units 01/20/2019  E chaffeensis Ab, IgG     Neg:<1:64 Negative  E chaffeensis Ab, IgM     Neg:<1:20 Negative  E.Chaffeensis (HME) IgG     Neg:<1:64 Negative  E. Chaffeensis (HME) IgM Titer     Neg:<1:20 Negative    Ref Range & Units 2wk ago  Western eq encephalitis, IgG <1:1 < 1:1   Eastern eq encephalitis, IgG <1:1 < 1:1   [place] encephalitis, IgG <1:1 < 1:1   [place] Enceph IgG <1:1 < 1:1   West Nile IgG CSF <1.30 IV 0.20   Component     Latest Ref Rng & Units 01/19/2019  Total  Protein, CSF     15 - 45 mg/dL 47 (H)   Component     Latest Ref Rng & Units 01/19/2019  Glucose, CSF     40 - 70 mg/dL 69   Component     Latest Ref Rng & Units 01/19/2019 01/19/2019         4:54 PM  4:54 PM  Tube #      1 4  Color, CSF     COLORLESS COLORLESS COLORLESS  Appearance, CSF     CLEAR CLEAR (A) CLEAR (A)  Supernatant      CLEAR NOT INDICATED  RBC Count, CSF     0 /cu mm 29 (H) 4 (H)  WBC, CSF     0 - 5 /cu mm 77 (HH) 79 (HH)  Segmented Neutrophils-CSF     0 - 6 % 40 (H) 35 (H)  Lymphs, CSF     40 - 80 % 51 40  Monocyte-Macrophage-Spinal Fluid     15 - 45 % 9 (L) 25   Component     Latest Ref Rng & Units 01/19/2019 01/21/2019  Color, Urine     YELLOW YELLOW STRAW (A)  Appearance     CLEAR CLEAR CLEAR  Specific Gravity, Urine     1.005 - 1.030 1.012 1.008  pH     5.0 - 8.0 8.0 6.0  Glucose, UA     NEGATIVE mg/dL NEGATIVE NEGATIVE  Hgb urine dipstick     NEGATIVE MODERATE (A) MODERATE (A)  Bilirubin Urine     NEGATIVE NEGATIVE NEGATIVE  Ketones, ur     NEGATIVE mg/dL NEGATIVE NEGATIVE  Protein     NEGATIVE mg/dL 604100 (A) NEGATIVE  Nitrite     NEGATIVE NEGATIVE NEGATIVE  Leukocytes,Ua     NEGATIVE  NEGATIVE NEGATIVE  RBC / HPF  0 - 5 RBC/hpf >50 (H) 6-10  WBC, UA     0 - 5 WBC/hpf 0-5 0-5  Bacteria, UA     NONE SEEN NONE SEEN NONE SEEN  Mucus      PRESENT PRESENT   Kidney US 01/21/2019 IMPRESSION: 1. No hydronephrosis or obstruction.  Head Ct 01/19/2019 IMPRESSION: 1. No acute intracranial abnormality. 2. Chronic pansinusitis.  CXR: 01/19/2019 IMPRESSION: No active disease.   Discharge Instructions: Discharge Instructions    Call MD for:  difficulty breathing, headache or visual disturbances   Complete by: As directed    Call MD for:  persistant dizziness or light-headedness   Complete by: As directed    Call MD for:  persistant nausea and vomiting   Complete by: As directed    Call MD for:  severe uncontrolled pain   Complete by: As directed    Diet - low sodium heart healthy   Complete by: As directed    Discharge instructions   Complete by: As directed    Thank you for allowing us taking [facility]. You were admitted for non bacterial meningitis. (Likely viral). We are glad that you feel better. This viral infection is NOT contagious. Please drink enough water and rest at home as needed. You kidney function (creatinin) was mildly abnormal, but your kidney ultrasound and urine test were unremarkable except some blood in your urine that you reported as a chronic issue. Please follow up with your primary care to repeat the tests and refer you to urologist if required.  Thank you You can reach us at [phone] if you have any questions or concern. Thank you   Increase activity slowly   Complete by: As directed       Signed: Chevis PrettyMasoudi, Kaidence Callaway, MD 02/01/2019, 6:48 PM   Pager: @MYPAGER @

## 2019-01-23 LAB — CSF CULTURE W GRAM STAIN: Culture: NO GROWTH

## 2019-01-24 LAB — LYME DISEASE, WESTERN BLOT
IgG P18 Ab.: ABSENT
IgG P23 Ab.: ABSENT
IgG P28 Ab.: ABSENT
IgG P30 Ab.: ABSENT
IgG P39 Ab.: ABSENT
IgG P41 Ab.: ABSENT
IgG P45 Ab.: ABSENT
IgG P58 Ab.: ABSENT
IgG P66 Ab.: ABSENT
IgG P93 Ab.: ABSENT
IgM P23 Ab.: ABSENT
IgM P39 Ab.: ABSENT
IgM P41 Ab.: ABSENT
Lyme IgG Wb: NEGATIVE
Lyme IgM Wb: NEGATIVE

## 2019-01-25 LAB — ARBOVIRUS IGG, CSF
California Enceph IgG: <1:1 {titer}
Eastern eq encephalitis, IgG: <1:1 {titer}
St Louis encephalitis, IgG: <1:1 {titer}
West Nile IgG CSF: 0.2 IV (ref <1.30)
Western eq encephalitis, IgG: <1:1 {titer}

## 2019-10-04 IMAGING — CT CT HEAD WITHOUT CONTRAST
3 series · 15 of 47 positions shown, 18 images · non-contrast
Comparison: None.

CLINICAL DATA: Severe headache and neck pain for 5 days.

EXAM:
CT HEAD WITHOUT CONTRAST
TECHNIQUE: Contiguous axial images were obtained from the base of the skull
through the vertex without intravenous contrast.

[Series 3: head 5.0 h30s · axial · 0.47mm/px · z∈[-78,+62]mm · 9 of 34 slices shown, 12 images]
[im 3/34  brain]
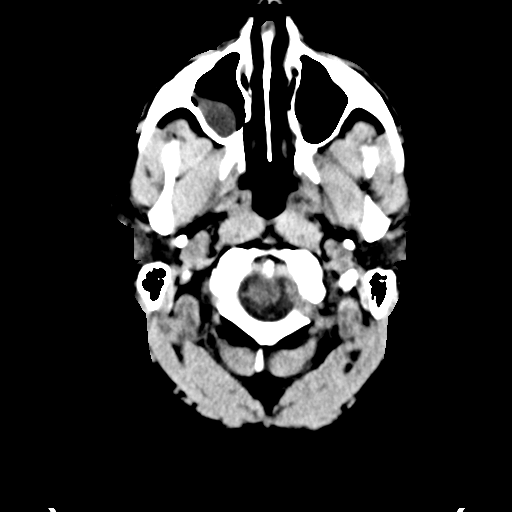
[im 3/34  bone]
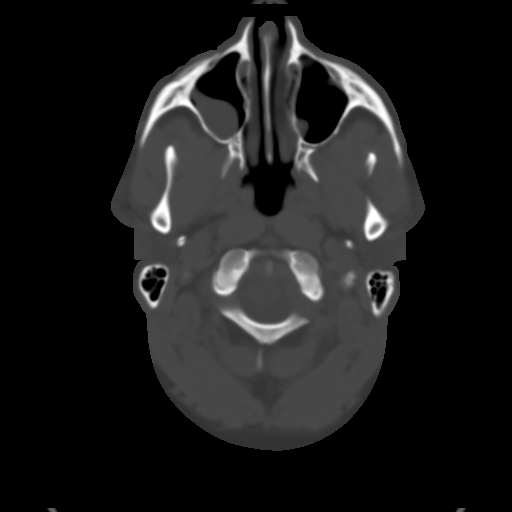
[im 6/34  brain]
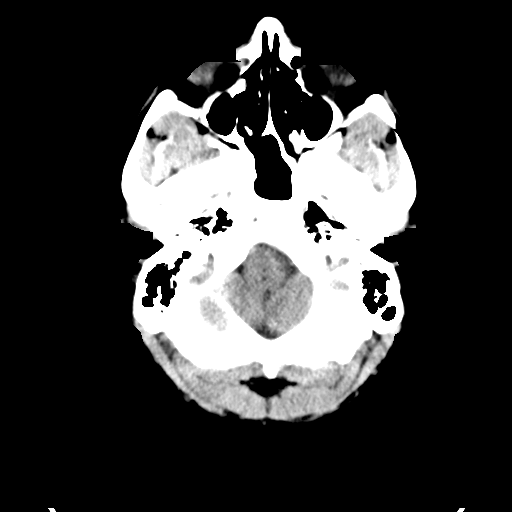
[im 10/34  brain]
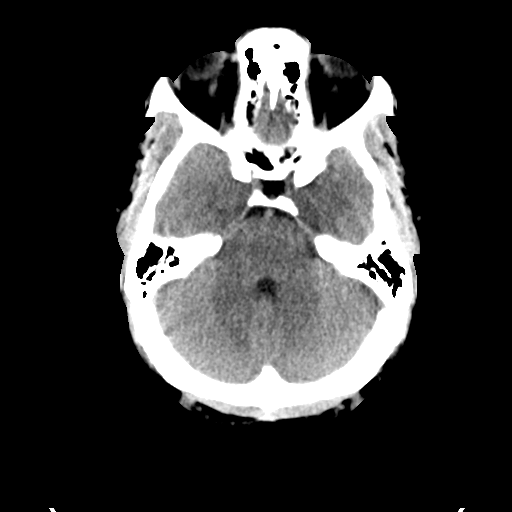
[im 13/34  brain]
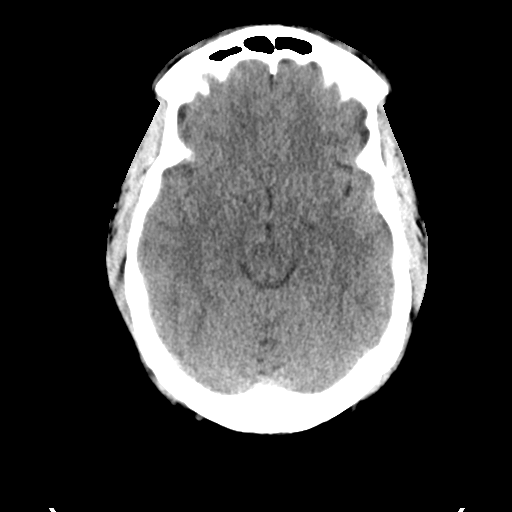
[im 18/34  brain]
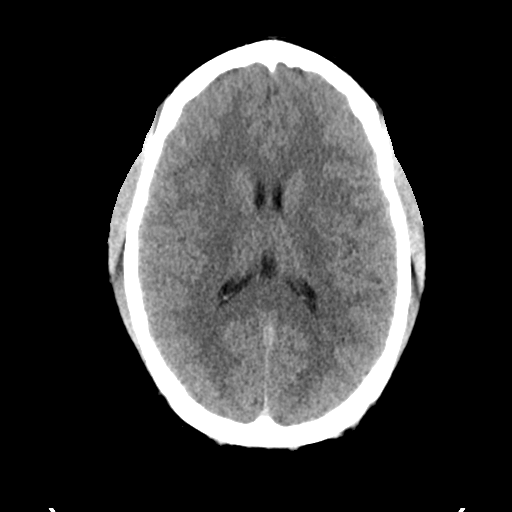
[im 18/34  bone]
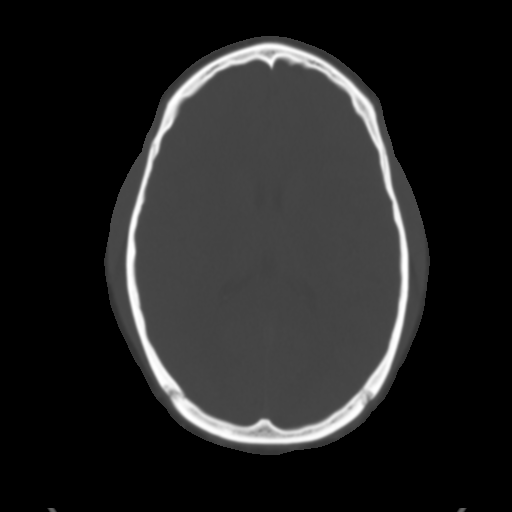
[im 21/34  brain]
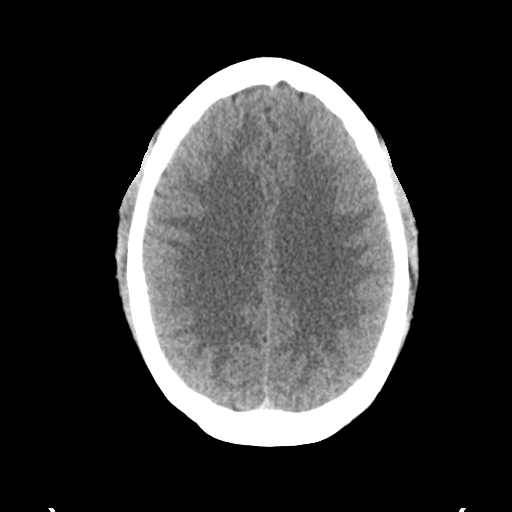
[im 24/34  brain]
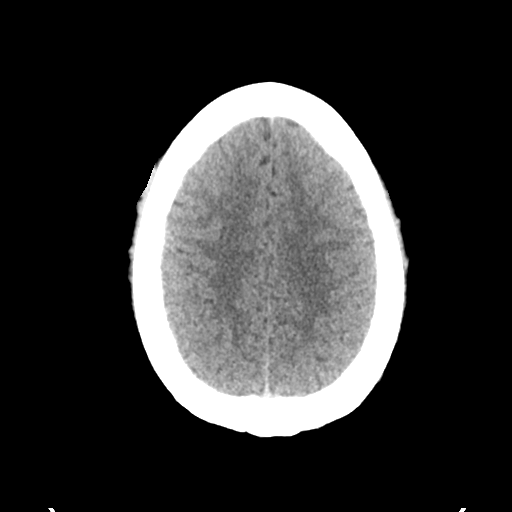
[im 28/34  brain]
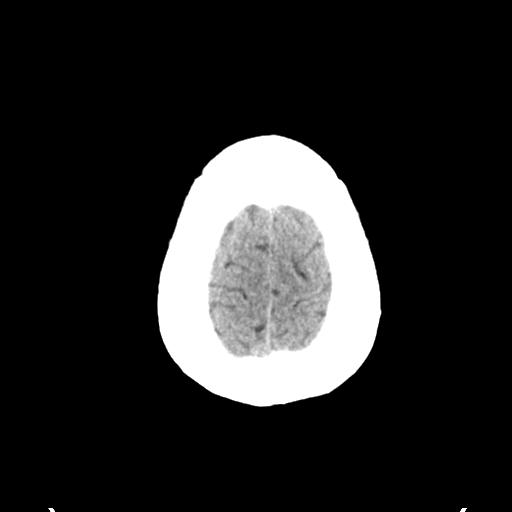
[im 31/34  brain]
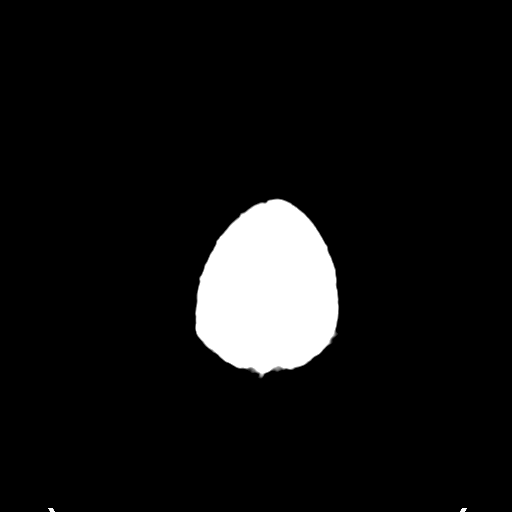
[im 31/34  bone]
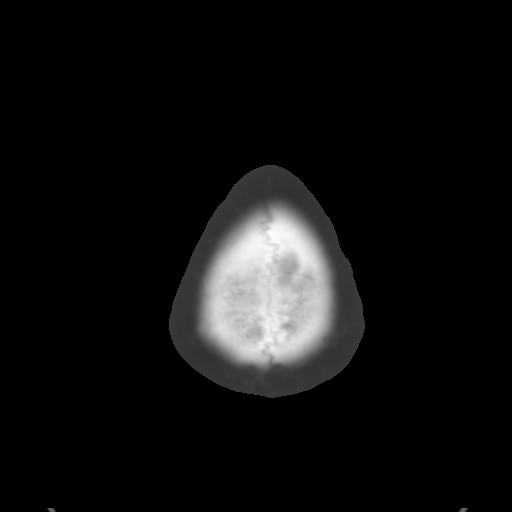

[Series 5: head 3.0 mpr cor · coronal · 0.34mm/px · 3 of 73 slices shown]
[im 25/73  brain]
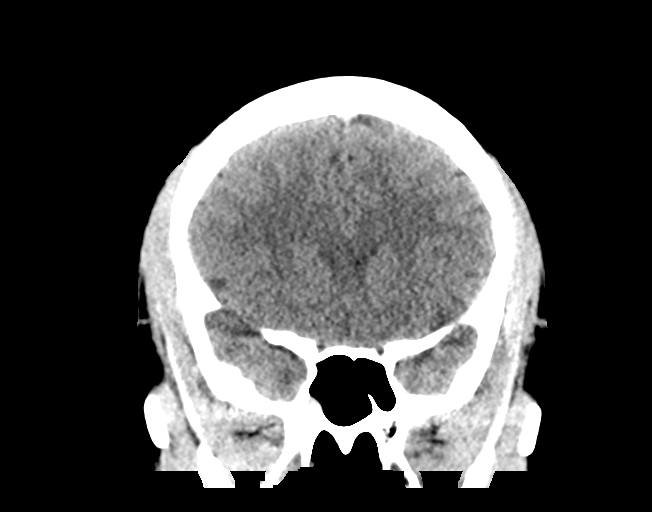
[im 33/73  brain]
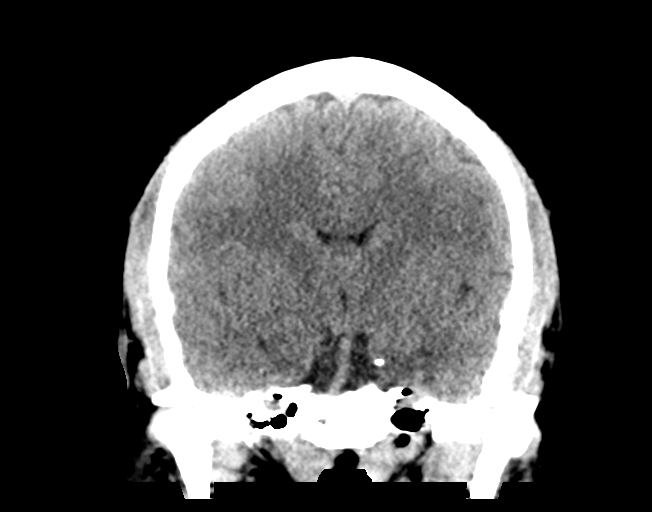
[im 41/73  brain]
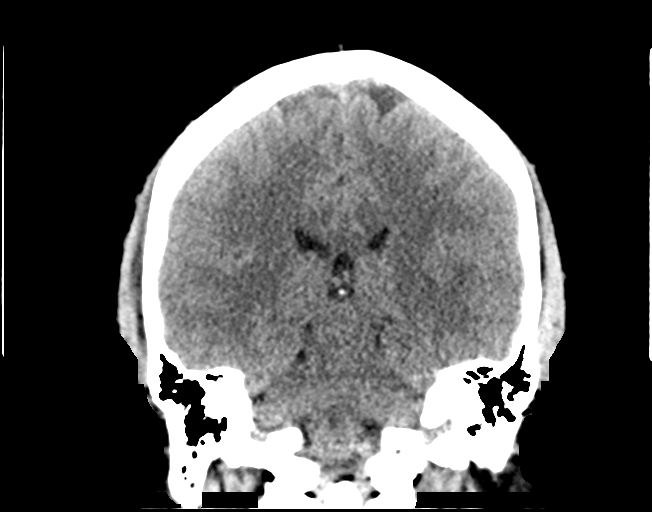

[Series 6: head 3.0 mpr sag · sagittal · 0.33mm/px · 3 of 65 slices shown]
[im 22/65  brain]
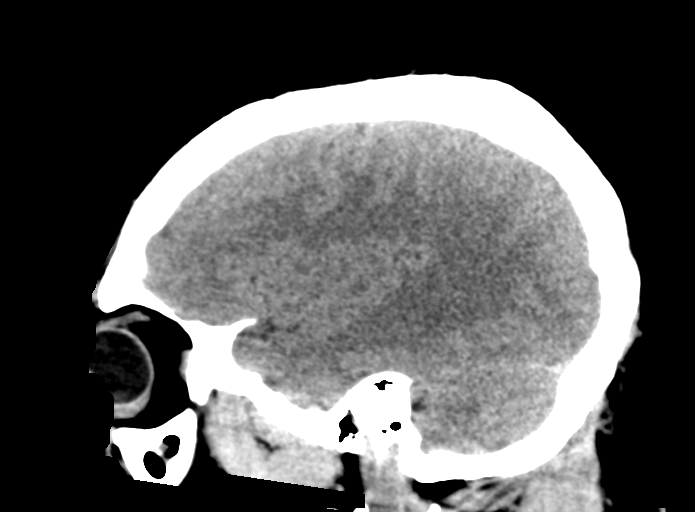
[im 33/65  brain]
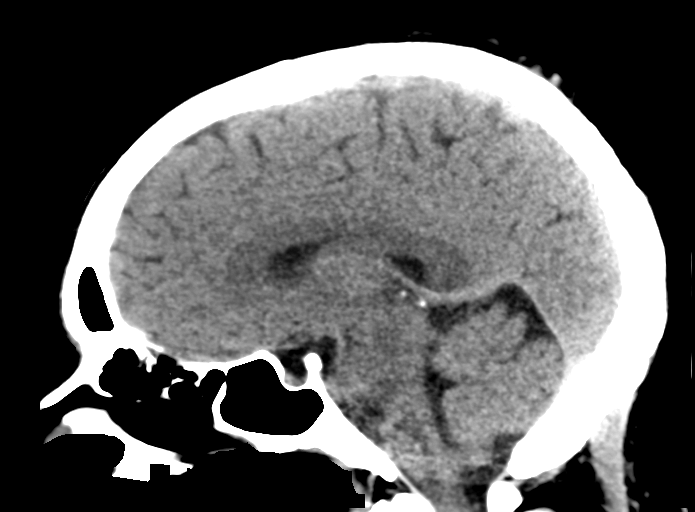
[im 43/65  brain]
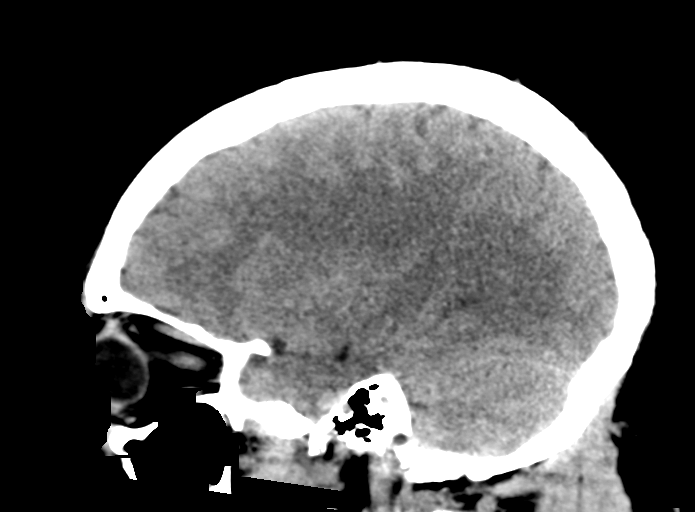

[15 of 47 positions shown; findings below may reference images not displayed]

FINDINGS: Brain: No evidence of acute infarction, hemorrhage, hydrocephalus,
extra-axial collection or mass lesion/mass effect.

Vascular: No hyperdense vessel or unexpected calcification.

Skull: Normal. Negative for fracture or focal lesion.

Sinuses/Orbits: Polypoid mucosal thickening of the maxillary,
ethmoid and sphenoid sinuses. Mastoid air cells are clear.

Other: None.
IMPRESSION: 1. No acute intracranial abnormality.
2. Chronic pansinusitis.

## 2019-10-06 IMAGING — US US RENAL
1 series · 14 of 25 positions shown · non-contrast
Comparison: None.

CLINICAL DATA: Acute renal injury

EXAM:
RENAL / URINARY TRACT ULTRASOUND COMPLETE

[Series 1: us renal · 14 of 35 slices shown]
[im 1/35]
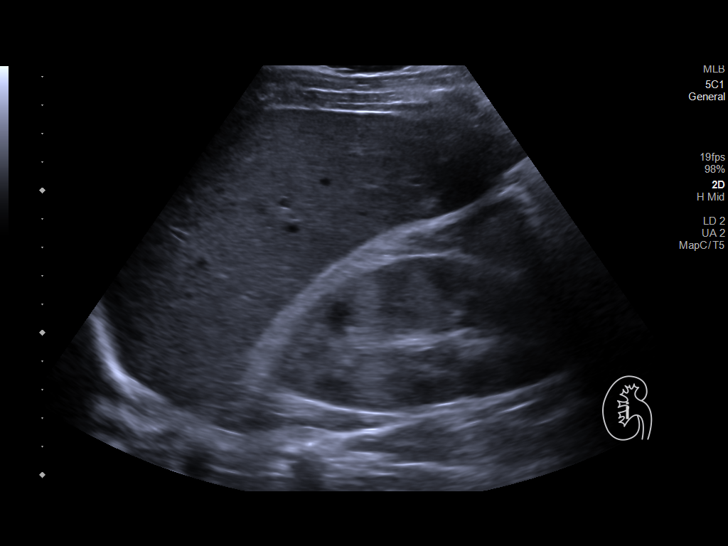
[im 3/35]
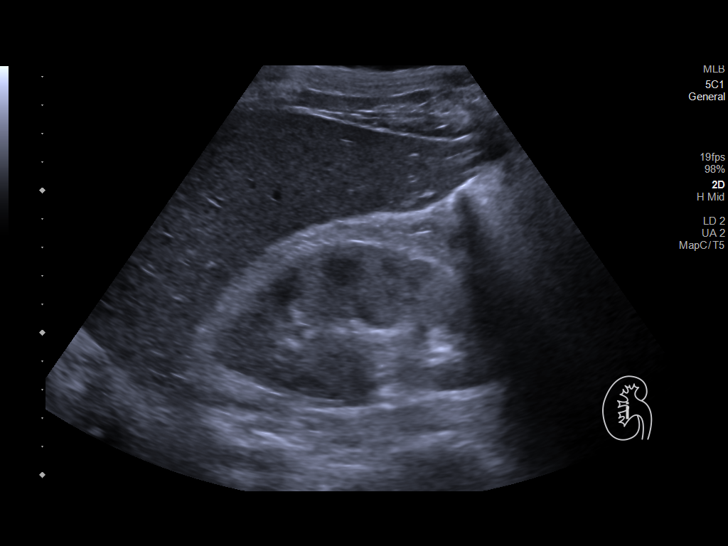
[im 6/35]
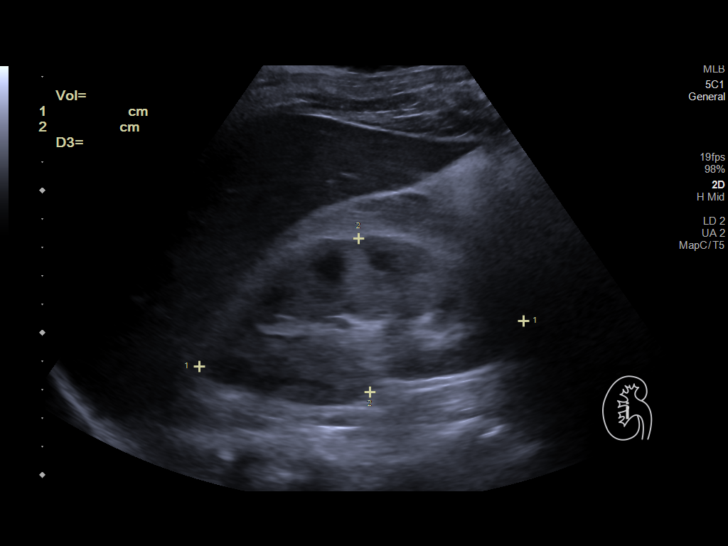
[im 9/35]
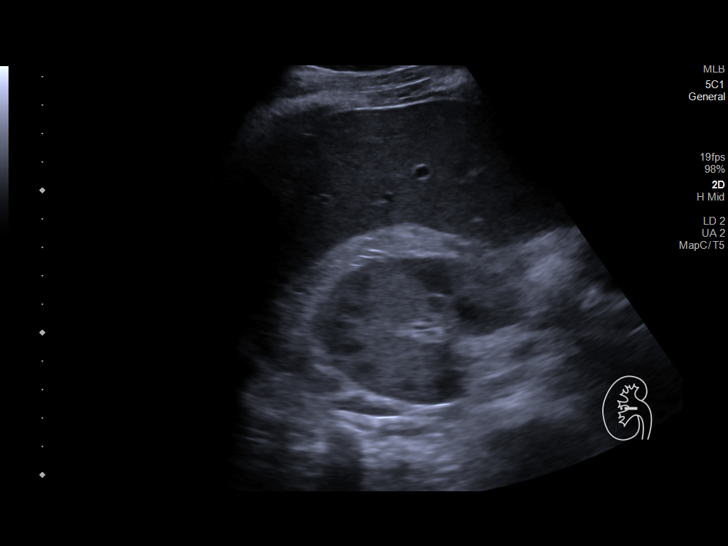
[im 12/35]
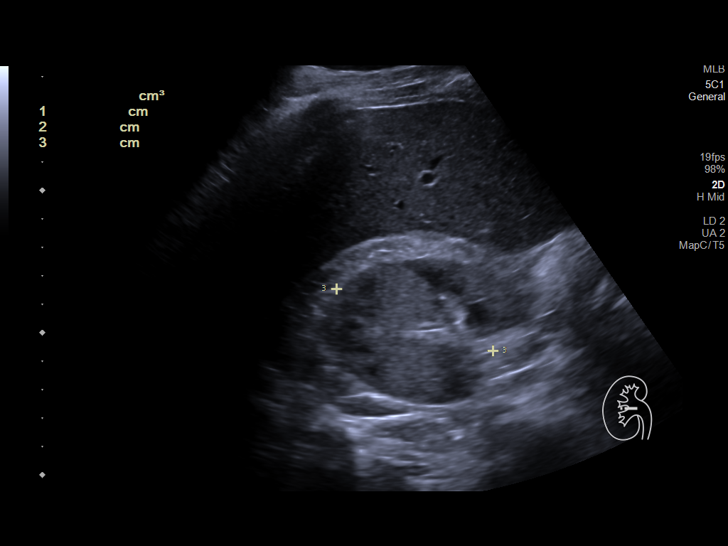
[im 13/35]
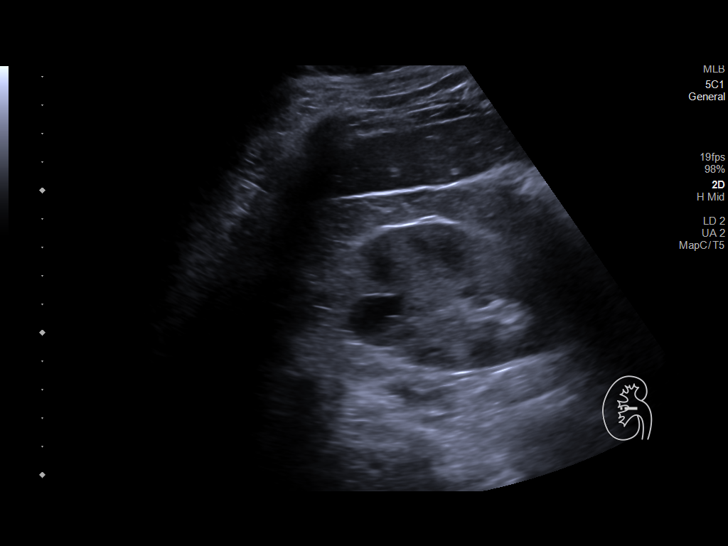
[im 16/35]
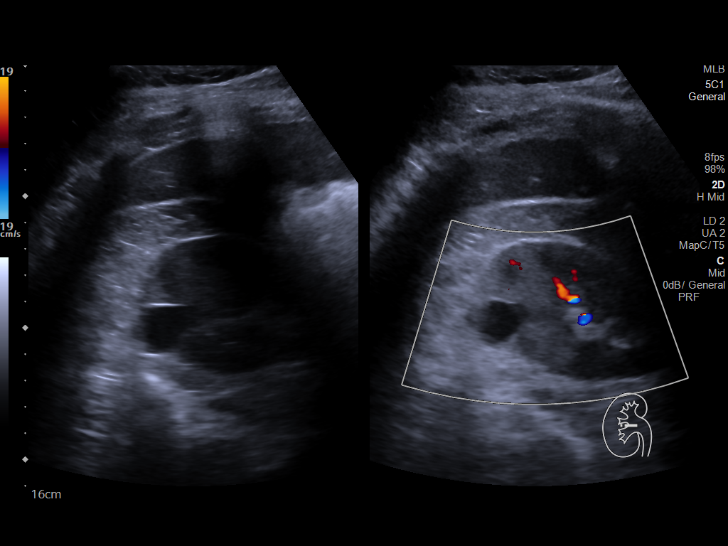
[im 19/35]
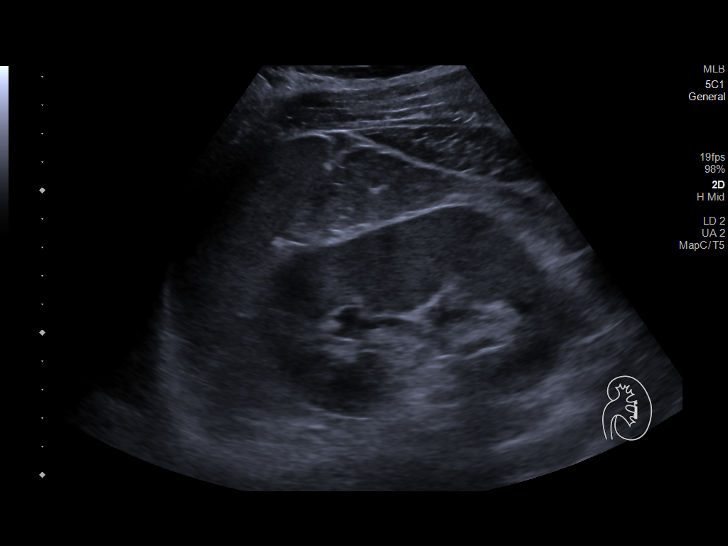
[im 22/35]
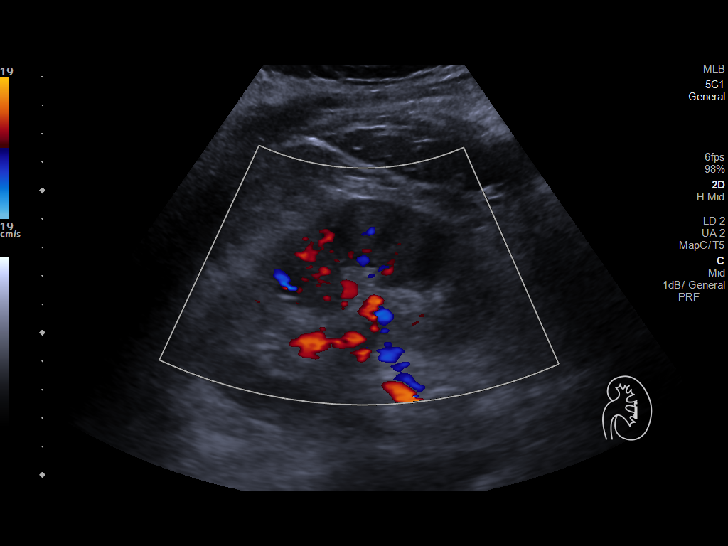
[im 23/35]
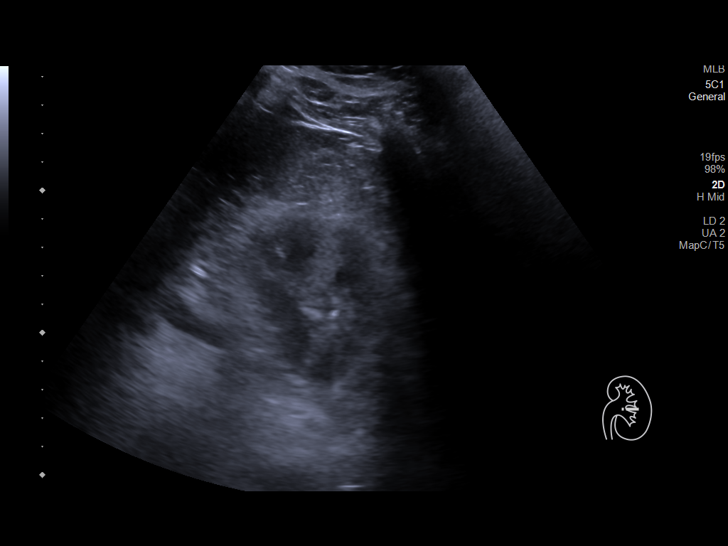
[im 26/35]
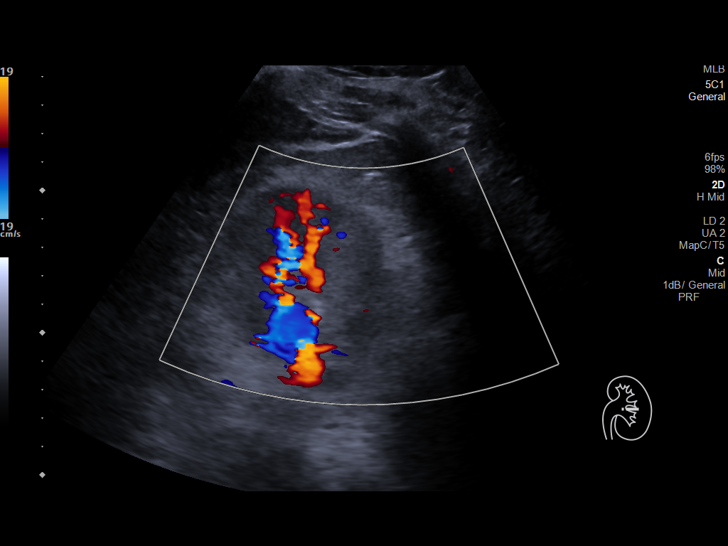
[im 29/35]
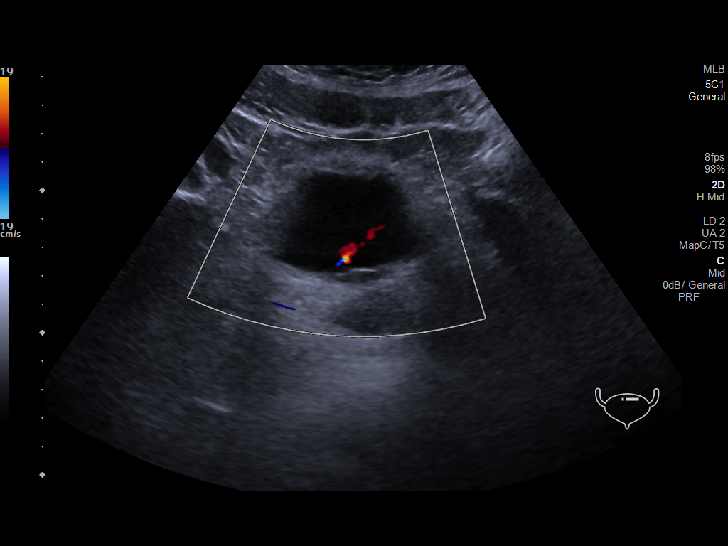
[im 32/35]
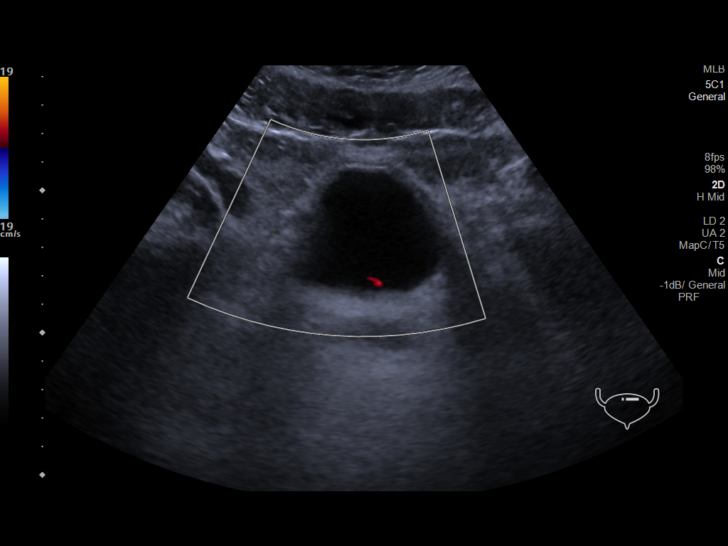
[im 35/35]
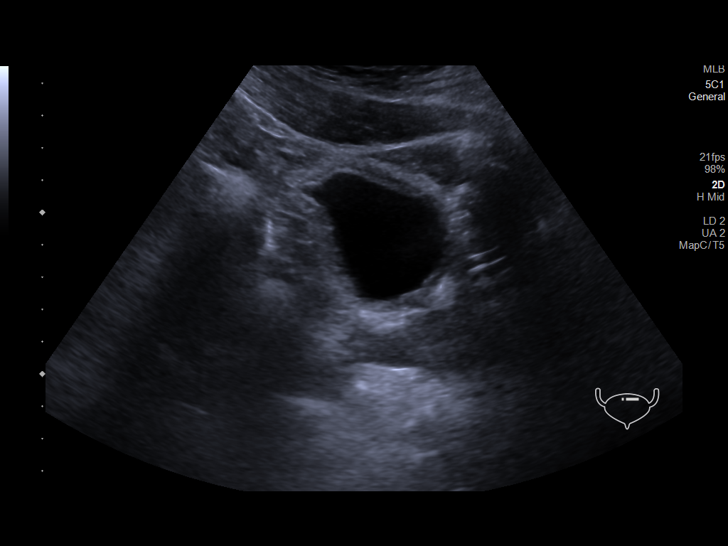

[14 of 25 positions shown; findings below may reference images not displayed]

FINDINGS: Right Kidney:

Renal measurements: 11.5 x 5.4 x 5.9 cm = volume: 193 mL. Small
benign cyst in the upper pole measuring 2 cm. No renal calculi or
obstruction

Left Kidney:

Renal measurements: 11.3 x 6.2 x 5.9 cm = volume: 215 mL. No
hydronephrosis. No calculi

Bladder:

Appears normal for degree of bladder distention.
IMPRESSION: 1. No hydronephrosis or obstruction.

## 2020-08-05 ENCOUNTER — Ambulatory Visit (HOSPITAL_COMMUNITY): Admit: 2020-08-05 | Discharge status: Absent without leave | Payer: Managed Care, Other (non HMO)

## 2020-08-05 ENCOUNTER — Ambulatory Visit (HOSPITAL_COMMUNITY)
Admission: EM | Admit: 2020-08-05 | Discharge: 2020-08-05 | Disposition: A | Discharge status: Home / Self Care | Payer: Managed Care, Other (non HMO) | Attending: Family Medicine | Admitting: Family Medicine

## 2020-08-05 ENCOUNTER — Encounter (HOSPITAL_COMMUNITY): Payer: Self-pay | Admitting: Emergency Medicine

## 2020-08-05 ENCOUNTER — Other Ambulatory Visit: Payer: Self-pay

## 2020-08-05 DIAGNOSIS — Z20822 Contact with and (suspected) exposure to covid-19: Secondary | ICD-10-CM | POA: Diagnosis present

## 2020-08-05 DIAGNOSIS — R059 Cough, unspecified: Secondary | ICD-10-CM | POA: Insufficient documentation

## 2020-08-05 DIAGNOSIS — U071 COVID-19: Secondary | ICD-10-CM | POA: Insufficient documentation

## 2020-08-05 NOTE — ED Triage Notes (Signed)
Patient reports uri symptoms around thanksgiving.  Patient reports cough will not go away.  Patient's step son tested positive for covid

## 2020-08-05 NOTE — Discharge Instructions (Addendum)
You have been tested for COVID-19 today. °If your test returns positive, you will receive a phone call from [facility] regarding your results. °Negative test results are not called. °Both positive and negative results area always visible on MyChart. °If you do not have a MyChart account, sign up instructions are provided in your discharge papers. °Please do not hesitate to contact us should you have questions or concerns. ° °

## 2020-08-06 LAB — SARS CORONAVIRUS 2 (TAT 6-24 HRS): SARS Coronavirus 2: POSITIVE — AB

## 2020-08-06 NOTE — ED Provider Notes (Signed)
  [facility]   [id number] 08/05/20 Arrival Time: 1710  ASSESSMENT & PLAN:  1. Exposure to COVID-19 virus   2. Cough      COVID-19 testing sent. See letter/work note on file for self-isolation guidelines. OTC symptom care as needed.    Follow-up Information    Soundra Pilon, FNP.   Specialty: Family Medicine Why: As needed. Contact information: [address] [place] [address] [phone]               Reviewed expectations re: course of current medical issues. Questions answered. Outlined signs and symptoms indicating need for more acute intervention. Understanding verbalized. After Visit Summary given.   SUBJECTIVE: History from: patient. Eugene Wiley is a 35 y.o. male who presents with worries regarding COVID-19. Known COVID-19 contact: family member. Recent travel: none. Reports: cough; several weeks; worse recently. Denies: fever and difficulty breathing. Normal PO intake without n/v/d.    OBJECTIVE:  Vitals:   08/05/20 1923  BP: 140/85  Pulse: 76  Resp: 18  Temp: 98 F (36.7 C)  TempSrc: Oral  SpO2: 96%    General appearance: alert; no distress Eyes: PERRLA; EOMI; conjunctiva normal HENT: [place]; AT; with mild nasal congestion Neck: supple  Lungs: speaks full sentences without difficulty; unlabored Extremities: no edema Skin: warm and dry Neurologic: normal gait Psychological: alert and cooperative; normal mood and affect  Labs:  Labs Reviewed  SARS CORONAVIRUS 2 (TAT 6-24 HRS)    No Known Allergies  Past Medical History:  Diagnosis Date  . Meningitis 12/2018   Social History   Socioeconomic History  . Marital status: Single    Spouse name: Not on file  . Number of children: Not on file  . Years of education: Not on file  . Highest education level: Not on file  Occupational History  . Not on file  Tobacco Use  . Smoking status: Never Smoker  . Smokeless tobacco: Never Used  Vaping Use  . Vaping Use:  Never used  Substance and Sexual Activity  . Alcohol use: Yes    Comment: socially  . Drug use: No  . Sexual activity: Not on file  Other Topics Concern  . Not on file  Social History Narrative  . Not on file   Social Determinants of Health   Financial Resource Strain: Not on file  Food Insecurity: Not on file  Transportation Needs: Not on file  Physical Activity: Not on file  Stress: Not on file  Social Connections: Not on file  Intimate Partner Violence: Not on file   History reviewed. No pertinent family history. Past Surgical History:  Procedure Laterality Date  . NO PAST SURGERIES       Mardella Layman, MD 08/06/20 [phone]

## 2023-03-06 ENCOUNTER — Encounter: Payer: Self-pay | Admitting: Family Medicine

## 2023-03-06 ENCOUNTER — Other Ambulatory Visit: Payer: Self-pay | Admitting: Family Medicine

## 2023-03-06 DIAGNOSIS — R102 Pelvic and perineal pain: Secondary | ICD-10-CM

## 2023-03-06 DIAGNOSIS — N5082 Scrotal pain: Secondary | ICD-10-CM

## 2023-03-07 ENCOUNTER — Other Ambulatory Visit: Payer: Self-pay | Admitting: Family Medicine

## 2023-03-07 DIAGNOSIS — N5082 Scrotal pain: Secondary | ICD-10-CM

## 2023-03-07 DIAGNOSIS — R102 Pelvic and perineal pain: Secondary | ICD-10-CM

## 2023-03-08 ENCOUNTER — Ambulatory Visit
Admission: RE | Admit: 2023-03-08 | Discharge: 2023-03-08 | Disposition: A | Discharge status: Home / Self Care | Payer: Managed Care, Other (non HMO) | Source: Ambulatory Visit | Attending: Family Medicine | Admitting: Family Medicine

## 2023-03-08 ENCOUNTER — Other Ambulatory Visit: Payer: Managed Care, Other (non HMO)

## 2023-03-08 DIAGNOSIS — N5082 Scrotal pain: Secondary | ICD-10-CM

## 2023-03-08 DIAGNOSIS — R102 Pelvic and perineal pain: Secondary | ICD-10-CM

## 2023-11-27 ENCOUNTER — Encounter: Payer: Self-pay | Admitting: Family Medicine

## 2023-11-27 ENCOUNTER — Other Ambulatory Visit: Payer: Self-pay | Admitting: Family Medicine

## 2023-11-27 DIAGNOSIS — N200 Calculus of kidney: Secondary | ICD-10-CM

## 2023-11-28 ENCOUNTER — Ambulatory Visit (HOSPITAL_COMMUNITY)
Admission: RE | Admit: 2023-11-28 | Discharge: 2023-11-28 | Disposition: A | Discharge status: Home / Self Care | Source: Ambulatory Visit | Attending: Family Medicine | Admitting: Family Medicine

## 2023-11-28 DIAGNOSIS — N200 Calculus of kidney: Secondary | ICD-10-CM | POA: Diagnosis present
